# Patient Record
Sex: Male | Born: 1935 | Race: White | Hispanic: No | Marital: Single | Smoking: Former smoker
Health system: Southern US, Community
[De-identification: ages and names within clinical notes are randomized; demographics above are authoritative.]

## PROBLEM LIST (undated history)

## (undated) DIAGNOSIS — K069 Disorder of gingiva and edentulous alveolar ridge, unspecified: Secondary | ICD-10-CM

## (undated) DIAGNOSIS — C801 Malignant (primary) neoplasm, unspecified: Secondary | ICD-10-CM

## (undated) DIAGNOSIS — I10 Essential (primary) hypertension: Secondary | ICD-10-CM

## (undated) DIAGNOSIS — E785 Hyperlipidemia, unspecified: Secondary | ICD-10-CM

## (undated) DIAGNOSIS — K469 Unspecified abdominal hernia without obstruction or gangrene: Secondary | ICD-10-CM

## (undated) DIAGNOSIS — Z86718 Personal history of other venous thrombosis and embolism: Secondary | ICD-10-CM

## (undated) DIAGNOSIS — I2510 Atherosclerotic heart disease of native coronary artery without angina pectoris: Secondary | ICD-10-CM

## (undated) DIAGNOSIS — I480 Paroxysmal atrial fibrillation: Secondary | ICD-10-CM

## (undated) DIAGNOSIS — I495 Sick sinus syndrome: Secondary | ICD-10-CM

## (undated) DIAGNOSIS — K635 Polyp of colon: Secondary | ICD-10-CM

## (undated) DIAGNOSIS — I209 Angina pectoris, unspecified: Secondary | ICD-10-CM

## (undated) DIAGNOSIS — I499 Cardiac arrhythmia, unspecified: Secondary | ICD-10-CM

## (undated) DIAGNOSIS — M1990 Unspecified osteoarthritis, unspecified site: Secondary | ICD-10-CM

## (undated) HISTORY — DX: Sick sinus syndrome: I49.5

## (undated) HISTORY — PX: COLON SURGERY: SHX602

## (undated) HISTORY — DX: Essential (primary) hypertension: I10

## (undated) HISTORY — DX: Disorder of gingiva and edentulous alveolar ridge, unspecified: K06.9

## (undated) HISTORY — DX: Polyp of colon: K63.5

## (undated) HISTORY — PX: APPENDECTOMY: SHX54

## (undated) HISTORY — DX: Paroxysmal atrial fibrillation: I48.0

## (undated) HISTORY — DX: Unspecified abdominal hernia without obstruction or gangrene: K46.9

## (undated) HISTORY — DX: Atherosclerotic heart disease of native coronary artery without angina pectoris: I25.10

## (undated) HISTORY — DX: Hyperlipidemia, unspecified: E78.5

---

## 1999-11-01 ENCOUNTER — Ambulatory Visit (HOSPITAL_COMMUNITY): Admission: RE | Admit: 1999-11-01 | Discharge: 1999-11-01 | Payer: Self-pay | Admitting: *Deleted

## 1999-11-01 ENCOUNTER — Encounter (INDEPENDENT_AMBULATORY_CARE_PROVIDER_SITE_OTHER): Payer: Self-pay | Admitting: Specialist

## 1999-11-20 ENCOUNTER — Encounter (HOSPITAL_COMMUNITY): Admission: RE | Admit: 1999-11-20 | Discharge: 2000-02-18 | Payer: Self-pay | Admitting: *Deleted

## 1999-11-22 ENCOUNTER — Encounter: Admission: RE | Admit: 1999-11-22 | Discharge: 1999-11-22 | Payer: Self-pay

## 1999-11-30 ENCOUNTER — Encounter (INDEPENDENT_AMBULATORY_CARE_PROVIDER_SITE_OTHER): Payer: Self-pay | Admitting: Specialist

## 1999-11-30 ENCOUNTER — Inpatient Hospital Stay (HOSPITAL_COMMUNITY): Admission: RE | Admit: 1999-11-30 | Discharge: 1999-12-12 | Payer: Self-pay

## 1999-12-08 ENCOUNTER — Encounter: Payer: Self-pay | Admitting: General Surgery

## 1999-12-10 ENCOUNTER — Encounter: Payer: Self-pay | Admitting: General Surgery

## 2001-04-27 ENCOUNTER — Encounter: Admission: RE | Admit: 2001-04-27 | Discharge: 2001-04-27 | Payer: Self-pay | Admitting: *Deleted

## 2001-04-27 ENCOUNTER — Encounter: Payer: Self-pay | Admitting: *Deleted

## 2001-05-28 ENCOUNTER — Encounter (INDEPENDENT_AMBULATORY_CARE_PROVIDER_SITE_OTHER): Payer: Self-pay | Admitting: Specialist

## 2001-05-28 ENCOUNTER — Ambulatory Visit (HOSPITAL_COMMUNITY): Admission: RE | Admit: 2001-05-28 | Discharge: 2001-05-28 | Payer: Self-pay | Admitting: *Deleted

## 2002-07-08 ENCOUNTER — Ambulatory Visit (HOSPITAL_COMMUNITY): Admission: RE | Admit: 2002-07-08 | Discharge: 2002-07-08 | Payer: Self-pay | Admitting: Cardiology

## 2002-07-08 ENCOUNTER — Encounter: Payer: Self-pay | Admitting: Cardiology

## 2002-07-08 HISTORY — PX: CARDIAC CATHETERIZATION: SHX172

## 2002-07-12 ENCOUNTER — Encounter: Payer: Self-pay | Admitting: Thoracic Surgery (Cardiothoracic Vascular Surgery)

## 2002-07-12 ENCOUNTER — Inpatient Hospital Stay (HOSPITAL_COMMUNITY)
Admission: RE | Admit: 2002-07-12 | Discharge: 2002-07-17 | Payer: Self-pay | Admitting: Thoracic Surgery (Cardiothoracic Vascular Surgery)

## 2002-07-12 HISTORY — PX: OTHER SURGICAL HISTORY: SHX169

## 2002-07-13 ENCOUNTER — Encounter: Payer: Self-pay | Admitting: Thoracic Surgery (Cardiothoracic Vascular Surgery)

## 2002-07-14 ENCOUNTER — Encounter: Payer: Self-pay | Admitting: Thoracic Surgery (Cardiothoracic Vascular Surgery)

## 2002-08-11 ENCOUNTER — Encounter
Admission: RE | Admit: 2002-08-11 | Discharge: 2002-08-11 | Payer: Self-pay | Admitting: Thoracic Surgery (Cardiothoracic Vascular Surgery)

## 2002-08-11 ENCOUNTER — Encounter: Payer: Self-pay | Admitting: Thoracic Surgery (Cardiothoracic Vascular Surgery)

## 2003-07-20 ENCOUNTER — Encounter (INDEPENDENT_AMBULATORY_CARE_PROVIDER_SITE_OTHER): Payer: Self-pay | Admitting: *Deleted

## 2003-07-20 ENCOUNTER — Ambulatory Visit (HOSPITAL_COMMUNITY): Admission: RE | Admit: 2003-07-20 | Discharge: 2003-07-20 | Payer: Self-pay | Admitting: *Deleted

## 2005-07-09 ENCOUNTER — Encounter: Admission: RE | Admit: 2005-07-09 | Discharge: 2005-07-09 | Payer: Self-pay | Admitting: Internal Medicine

## 2005-07-18 ENCOUNTER — Encounter: Admission: RE | Admit: 2005-07-18 | Discharge: 2005-08-07 | Payer: Self-pay | Admitting: Internal Medicine

## 2005-09-18 ENCOUNTER — Ambulatory Visit (HOSPITAL_COMMUNITY): Admission: RE | Admit: 2005-09-18 | Discharge: 2005-09-18 | Payer: Self-pay | Admitting: *Deleted

## 2007-01-02 ENCOUNTER — Ambulatory Visit (HOSPITAL_COMMUNITY): Admission: RE | Admit: 2007-01-02 | Discharge: 2007-01-02 | Payer: Self-pay | Admitting: Ophthalmology

## 2007-11-04 ENCOUNTER — Ambulatory Visit (HOSPITAL_COMMUNITY): Admission: RE | Admit: 2007-11-04 | Discharge: 2007-11-04 | Payer: Self-pay | Admitting: *Deleted

## 2008-04-06 ENCOUNTER — Encounter: Admission: RE | Admit: 2008-04-06 | Discharge: 2008-04-06 | Payer: Self-pay | Admitting: Internal Medicine

## 2008-04-25 ENCOUNTER — Encounter: Admission: RE | Admit: 2008-04-25 | Discharge: 2008-04-25 | Payer: Self-pay | Admitting: Internal Medicine

## 2009-10-20 ENCOUNTER — Encounter: Admission: RE | Admit: 2009-10-20 | Discharge: 2009-10-20 | Payer: Self-pay | Admitting: Internal Medicine

## 2010-10-07 HISTORY — PX: VENTRAL HERNIA REPAIR: SHX424

## 2010-10-07 HISTORY — PX: HERNIA REPAIR: SHX51

## 2011-02-19 NOTE — Op Note (Signed)
NAME:  Darryl Hall, Darryl Hall NO.:  [id number]   MEDICAL RECORD NO.:  [id number]          PATIENT TYPE:  AMB   LOCATION:  ENDO                         FACILITY:  [facility]   PHYSICIAN:  Georgiana Spinner, M.D.    DATE OF BIRTH:  [birth year]   DATE OF PROCEDURE:  11/04/2007  DATE OF DISCHARGE:                               OPERATIVE REPORT   PROCEDURE:  Colonoscopy.   INDICATIONS:  Colon cancer.   ANESTHESIA:  Fentanyl 85 mcg, Versed 8.5 mg.   PROCEDURE:  With the patient mildly sedated in the left lateral  decubitus position, the Pentax videoscopic colonoscope was inserted in  the rectum after normal rectal exam and passed under direct vision to  the neo-cecum, was which was photographed.  From this point the  colonoscope was slowly withdrawn taking circumferential views of the  colonic mucosa stopping only in the rectum, which appeared normal on  direct and showed hemorrhoids on retroflexed view.  The endoscope was  straightened and withdrawn.   FINDINGS:  Internal hemorrhoids, otherwise negative examination.   PLAN:  Have the patient follow up with me in 5 years or as needed.  Resume all medications at this time.  In addition Romazicon and Narcan  were given.           ______________________________  Georgiana Spinner, M.D.     GMO/MEDQ  D:  11/04/2007  T:  11/04/2007  Job:  [id number]

## 2011-02-22 NOTE — Procedures (Signed)
[facility]  Patient:    Darryl Hall, Darryl Hall Visit Number: [id number] MRN: [id number]          Service Type: END Location: ENDO Attending Physician:  Sabino Gasser Proc. Date: 05/28/01 Adm. Date:  05/28/2001   CC:         Zigmund Daniel, M.D.   Procedure Report  PROCEDURE:  Colonoscopy with polypectomy and biopsy.  INDICATIONS:  Follow-up of colon cancer.  ANESTHESIA:  Demerol 50, Versed 6 mg.  DESCRIPTION OF PROCEDURE:  With the patient mildly sedated in the left lateral decubitus position, a rectal exam was performed which was unremarkable. Subsequently, the Olympus videoscopic colonoscope was inserted in the rectum and passed under direct vision to the small intestine anastomosis to the colon and the neocecum.  Photographs were taken.  From this point, the colonoscope was slowly withdrawn, taking circumferential view of the entire colonic mucosa until we stopped in the rectum, which appeared normal on direct view, but on retroflex view showed a polyp just proximal to the anal canal.  This was photographed and removed, first using hot biopsy forceps technique, subsequently using the snare cautery technique setting of 20-20 blended current for both.  Tissue was obtained for pathology.  The endoscope was straightened and withdrawn.  The patients vital signs and pulse oximeter remained stable.  The patient tolerated the procedure well without apparent complications.  FINDINGS:  Polyps of rectum.  PLAN:  Await biopsy report.  The patient will call me for results and follow up with me as an outpatient. Attending Physician:  Sabino Gasser DD:  05/28/01 TD:  05/29/01 Job: [id number]

## 2011-02-22 NOTE — Discharge Summary (Signed)
NAME:  Darryl Hall, Darryl Hall NO.:  [id number]   MEDICAL RECORD NO.:  [id number]                   PATIENT TYPE:  INP   LOCATION:  2004                                 FACILITY:  [facility]   PHYSICIAN:  Salvatore Decent. Dorris Fetch, M.D.         DATE OF BIRTH:  06-29-1926   DATE OF ADMISSION:  07/12/2002  DATE OF DISCHARGE:  07/17/2002                                 DISCHARGE SUMMARY   ADMISSION DIAGNOSIS:  Three vessel coronary artery disease with progressive  exertional dyspnea.   PAST MEDICAL HISTORY:  1. Colon cancer, status post colon surgery x2.  Both surgeries were     complicated by deep vein thrombosis and he has required an IVC filter.  2. Hyperlipidemia.  3. 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   PAST SURGICAL HISTORY:  1. Appendectomy.  2. Previous left cataract surgery.   ALLERGIES:  No known drug allergies.   DISCHARGE DIAGNOSIS:  Three vessel coronary artery disease with exertional  dyspnea, status post coronary artery bypass grafting.   HISTORY OF PRESENT ILLNESS:  The patient is a 77 year old man.  He has had  an approximately two year history of sharp right-sided chest pain which he  has previously attributed to previous colon surgeries.  He has no previous  cardiac history.  Over the past several months, shortness of breath with  exertion which is worsening recently.  He underwent an exercise challenge  test which was positive with ST changes.  He underwent cardiac  catheterization on 07/08/02, which revealed severe three vessel coronary  artery disease with a codominate system.  His lesions were not amenable to  PCI, and therefore a CVTS consult was requested.  The patient was evaluated  by Dr. Dorris Fetch later in the day on 07/08/02.  After examination, the  patient revealed all available records, including catheterization films.  Dr. Dorris Fetch recommended coronary artery bypass grafting as the preferred  treatment  choice for this gentleman.  Procedure, risks and benefits were  discussed with the patient, and he agreed to proceed.  Plan was for him to  stay off of his Coumadin until surgery, that will be one week total.  It was  felt that there was some risk of deep vein thrombosis formation in the  interim, he does have an IVC filter which will protect him from pulmonary  embolus.   HOSPITAL COURSE:  On 07/12/01, the patient was electively admitted to [facility] under the care of Dr. Charlett Lango.  He underwent the  following surgical procedure.  Coronary artery bypass grafting x6.  Grafts  placed at the time of procedure included a left internal mammary artery  grafted to the left anterior descending artery, saphenous vein was grafted  to the first diagonal artery, saphenous vein was grafted in a sequential  fashion to the first obtuse marginal and second obtuse marginal arteries,  saphenous vein was  grafted in a sequential fashion to the conus branch and  posterior descending artery.  Vein was harvested from the right thigh via  the endoscopic vein harvest technique, and in a conventional fashion from  the lower legs bilaterally.  He tolerated his procedure reasonably well, and  was transferred in stable condition to the SICU.   The patient has remained hemodynamically stable since surgery, and his  postoperative course has been essentially uneventful.  He was extubated in  the immediate postoperative period.  Awoke from anesthesia neurologically  intact.  His anticoagulation therapy was restarted on postoperative day #1,  with Lovenox and Coumadin.  The patient has had very good progress in  recovering from his surgery.  On the morning of 07/16/02, that is  postoperative day #5, the patient reported feeling very well, his vital  signs were stable, afebrile.  His heart was in normal sinus rhythm.  Lungs  were clear.  He is tolerating small amounts of food.  No nausea.  His bowel   and bladder functions were within normal limits for him.  His incisions were  all healing well.  He does have mild right lower extremity edema.  His pain  was well controlled.  He was ambulating independently.  The patient was  ready for discharge on the morning of 07/17/02.   LABORATORY DATA:  On the morning of 07/14/02, CBC showed a white blood cell  count of 8.9, hemoglobin 11.5, hematocrit 34.8, platelets 141.  The morning  of 07/15/02, PT 20.6, INR 2.0.  On 07/14/02, chemistries showed a sodium of  141, potassium 4.1, chloride 106, CO2 28, BUN 22, creatinine 1.1, calcium  8.3.   CONDITION ON DISCHARGE:  Improved.   DISCHARGE MEDICATIONS:  1. Ultram 50 mg one or two p.o. q.4-6h. p.r.n. pain.  2. Lopressor 25 mg p.o. b.i.d.  3. Coumadin 4 mg p.o. q.d.  4. Digoxin 0.125 mg p.o. q.d.   ACTIVITY:  He has been asked to refrain from any driving or heavy lifting,  pushing or pulling.  He has been asked to continue his breathing exercises  and daily walking.   DIET:  Low fat, low salt.   WOUND CARE:  He may shower with mild soap and water.  If his incisions are  red, hot, swollen draining, or if he has a fever greater then 101 degrees  Farenheit, he is to call Dr. Sunday Corn office.   FOLLOWUP:  1. He will have his blood drawn at [facility] on Monday,     07/19/02, for a PT and INR.  They will dose his Coumadin as appropriate.  2. Dr. Aleen Campi would like to see him in approximately two weeks.  He has     been asked to call to arrange that     appointment.  3. He has an appointment to see Dr. Dorris Fetch at the [facility] on Wednesday,     08/11/02 at 10:30 a.m.  He has been asked to get a chest x-ray at     [facility] approximately one hour before that     appointment.     Toribio Harbour, R.N.                  Salvatore Decent. Dorris Fetch, M.D.    CTK/MEDQ  D:  08/23/2002  T:  08/24/2002  Job:  [id number]  cc:   Aram Candela. Aleen Campi, M.D.  [address]  [place]  [place] [address]  Fax: [phone]  Christiana Fuchs, M.D.  [address]  [place], [place] [address]  Fax: [phone]

## 2011-02-22 NOTE — Op Note (Signed)
   NAME:  ESWIN, WORRELL NO.:  [id number]   MEDICAL RECORD NO.:  [id number]                   PATIENT TYPE:  AMB   LOCATION:  ENDO                                 FACILITY:  [facility]   PHYSICIAN:  Georgiana Spinner, M.D.                 DATE OF BIRTH:  [birth year]   DATE OF PROCEDURE:  DATE OF DISCHARGE:                                 OPERATIVE REPORT   PROCEDURE:  Colonoscopy with polypectomy.   INDICATIONS FOR PROCEDURE:  History of colon cancer, colon polyps.   ANESTHESIA:  Demerol 60, Versed 6 mg.   DESCRIPTION OF PROCEDURE:  With the patient mildly sedated in the left  lateral decubitus position, the Olympus videoscopic colonoscope was inserted  in the rectum and passed under direct vision to the neocecum. Once  identified, we were able to see the small bowel at its anastomosis. The  colonoscope was slowly withdrawn taking circumferential views of the entire  colonic mucosa stopping in the neocecum first where a polyp was seen,  photographed and removed using snare cautery technique setting of 20:20  blended current. The polyp was suctioned into the endoscope and retrieved  after removal. The endoscope was then withdrawn all the way to the rectum  which appeared normal on direct and showed hemorrhoids on retroflexed view.  The endoscope was straightened and withdrawn. The patient's vital signs and  pulse oximeter remained stable. The patient tolerated the procedure well  without apparent complications.   FINDINGS:  Internal hemorrhoids with polyp in the neocecum, otherwise, an  unremarkable colonoscopic examination.   PLAN:  Await biopsy report. The patient will call me for results and  followup with me as an outpatient.                                               Georgiana Spinner, M.D.    GMO/MEDQ  D:  07/20/2003  T:  07/20/2003  Job:  [id number]

## 2011-02-22 NOTE — Consult Note (Signed)
NAME:  Darryl Hall, Darryl Hall NO.:  [id number]   MEDICAL RECORD NO.:  [id number]                   PATIENT TYPE:  OIB   LOCATION:                                       FACILITY:  [facility]   PHYSICIAN:  Salvatore Decent. Dorris Fetch, M.D.         DATE OF BIRTH:  August 20, 1926   DATE OF CONSULTATION:  07/08/2002  DATE OF DISCHARGE:                                   CONSULTATION   REASON FOR CONSULTATION:  Three vessel coronary disease with exertional  dyspnea.   CHIEF COMPLAINT:  Shortness of breath with exertion.   HISTORY OF PRESENT ILLNESS:  The patient is a 77 year old gentleman who has  about a two-year history of a sharp right-sided chest pain which has been  attributed to previous colon surgeries.  He has no previous cardiac history.  Over the past several months, he has noted shortness of breath with exertion  and this has been worsening recently.  He does not have any chest pain or  tightness associated with this exertional dyspnea.  He does not have the  shortness of breath  at rest or at night.  It usually will resolve with  resting for a few minutes.  He does not have nausea or diaphoresis in  association with it.  He had an exercise tolerance test which was positive  with ST changes.  He did experience shortness of breath during the exercise  tolerance test, but not the chest pain which he had been having for the past  few years.  At cardiac catheterization  today he was found to have severe  three vessel coronary disease with a codominant system.  There is right  coronary with approximately  90% stenosis in the large internal branch and a  70% in the mid right.  In the circumflex, there is a large bifurcating OM-1  with a tight stenosis at the bifurcation approximately  95%.  There is also  disease in the circumflex applying a smaller posterior lateral branch.  He  has about a 70 to 75% mid LAD lesion.  There is also involvement of the  diagonal branch.  His  ejection fraction is 40% with inferior hypokinesis.  He also had mild anterior and moderate apical hypokinesis.   His past medical history is significant for colon cancer.  He has had  surgery x 2.  Both times complicated by deep venous thrombosis and has  required IVC filter.  He has had hyperlipidemia and blindness in his left  eye secondary to detached retina.  He has had an appendectomy and previous  left cataract surgery.   MEDICATIONS:  1. Toprol 25 mg p.o. q.d.  2. Coumadin 4 mg p.o. q.d.  On hold since Monday.   ALLERGIES:  He has no known drug allergies.   FAMILY HISTORY:  His father died at age 52 of  myocardial infarction .  Mother was unknown causes.   SOCIAL  HISTORY:  He is retired.  He stays very active working at  [employer].  He is married.  He has a past history of smoking, but quit many  years ago.  He does not use ethanol.   REVIEW OF SYSTEMS:  He wears glasses.  He is completely blind in his left  eye.  He is hard of hearing.  He has dentures both upper and lower.  He  occasionally has a cough with sputum production.  He does have nocturnal leg  cramps.  He has pain in his right shoulder and knee chronically.  He had a  skin cancer removed from his lip one week ago, likely a basal cell.  He does  bleed and bruise easily since he has been on Coumadin.   PHYSICAL EXAMINATION:  GENERAL:  The patient is a well-appearing 77 year old  white male in no acute distress.  In general,  he is well developed and well  nourished in no acute distress.  He is hard of hearing.  VITALS:  His blood pressure  is 144/84, heart rate is 70 and regular,  respirations are 12 and nonlabored.  NEUROLOGIC:  He is alert and oriented x 3.  His left pupil is nonreactive.  He has no vision in his left eye. Motor function is grossly intact.  HEENT:  He has dentures and glasses.  He has a scar formation from removal  of the skin cancer just above his upper lip. It does not appear  infected.  NECK:  Supple without thyromegaly, adenopathy or bruits.  CARDIAC:  Regular rate and rhythm.  Normal S1, S2.  No murmurs, rubs or  gallops.  LUNGS:  Clear to auscultation  and percussion bilaterally.  EXTREMITIES:  Without clubbing, cyanosis or edema.  He does have 1+ dorsalis  pedis and posterior tibial pulses bilaterally.  There are no obvious  varicosities.  SKIN:  Warm, pink and dry.   IMPRESSION:  The patient is a 77 year old white male who presents with  progressive exertional dyspnea.  He has severe two vessel disease by  catheterization with impaired left ventricular function.  Coronary artery  bypass grafting is indicated for survival benefit and relief of symptoms.  He also has a chest pain syndrome which does not appear to be related to his  coronary disease as it is nonexertional and has existed prior to his onset  of shortness of breath.  I have discussed in detail with the patient  and  his wife the indications, risks, benefits and alternatives for treatment.  They understand the incision to be used with bypass surgery and the need for  multiple grafts.  We will use thigh vein because of his history of deep  venous thrombosis and may need to use vein from both legs.  They understand  the risks of  surgery include, but are not limited to stroke, death, MI,  DVT, PE, which is at increased risk for bleeding, possible need for  transfusion, infection as well as other organ system dysfunction including  respiratory, renal, hepatic or GI complications.  He understands and accepts  these risks and agrees to proceed.  He will be scheduled for surgery as an  outpatient with plan to come in  Monday morning on 10/06.  He has been off  his Coumadin since this past Monday,  that will be one week.  Although there  is some risk of  deep venous thrombosis formation in the interim,  he does have an  IVC filter which will protect him from pulmonary embolus.  The risk  of deep  venous thrombosis formation in the short time period he will be off  Coumadin is inconsequential and he will need to be off the Coumadin for a  week prior to his surgery in any event.  The patient is in agreement with  the plan as outlined.  He will be admitted on 10/06.                                               Salvatore Decent Dorris Fetch, M.D.    SCH/MEDQ  D:  07/08/2002  T:  07/09/2002  Job:  [id number]   cc:   Christiana Fuchs, M.D.  [address]  [place], [place] [address]  Fax: [phone]

## 2011-02-22 NOTE — Cardiovascular Report (Signed)
NAME:  Darryl Hall, Darryl Hall NO.:  [id number]   MEDICAL RECORD NO.:  [id number]                   PATIENT TYPE:  OIB   LOCATION:  2857                                 FACILITY:  [facility]   PHYSICIAN:  John R. Tysinger, M.D.              DATE OF BIRTH:  [birth year]   DATE OF PROCEDURE:  07/08/2002  DATE OF DISCHARGE:                              CARDIAC CATHETERIZATION   CARDIOLOGIST:  Aram Candela. Aleen Campi, M.D.   PROCEDURES:  1. Left heart catheterization.  2. Coronary cineangiography.  3. Left internal mammary artery cineangiography.  4. Left ventricular cineangiography.  5. Abdominal aortogram.  6. Perclose of the right femoral artery.   INDICATIONS FOR PROCEDURE:  This 77 year old male has a history of chest  pain and high-risk profile with hypertension and recurrent deep vein  thrombosis requiring Greenfield filter placement in his inferior vena cava.  He has been on chronic Coumadin.  He also has a history of smoking and  hyperlipidemia.  His family history includes father of a heart attack at age  54.  He underwent a treadmill exercise tolerance test on 06/30/2002 finding a  positive test with 2 mm ST depression in early stages with a treadmill score  of -4 consistent with a moderate to severe risk of ischemia.   DESCRIPTION OF PROCEDURE:  After signing an informed consent, the patient  was medication with 50 mg of Benadryl intravenously and brought to the  cardiac catheterization lab.  His right groin was prepped and draped in a  sterile fashion and anesthetized locally with 1% lidocaine. The 6-French  introducer sheath was inserted percutaneously into the right femoral artery.  The 6-French #4 Judkins coronary catheters were used to make injections into  the native coronary arteries.  The right coronary catheter was used to make  midstream injection into the left subclavian artery visualizing the left  internal mammary artery.  A 6-French pigtail  catheter was used to measure  pressures in the left ventricle and aorta and to make midstream injection  into the left ventricle and abdominal aorta. The patient tolerated the  procedure well,and no complications were noted.  At the end of the  procedure, the catheter and sheaths were removed from the right femoral  artery, and hemostasis was easily obtained with a Perclose closure systems.   MEDICATIONS GIVEN:  None.   HEMODYNAMIC DATA:  Left ventricular pressure 180/10 to 26.  Aortic pressure  178/94 with a mean of 126.  Left ventricular ejection fraction was measured  by computer at 36%, visually it appears to be approximately 35 to 40%.   CINE FINDINGS:  CORONARY CINEANGIOGRAPHY:  1. Left coronary artery:  The ostium and left main appear normal.  2. Left anterior descending:  The LAD has a focal eccentric 70 to 80%     stenotic lesion in its proximal to middle segment.  There is a second  minor plaque in the middle segment causing a 20% stenosis.  The distal     LAD is a large vessel which wraps around the apex and supplies     approximately 50% of  the posterior descending circulation.  3. Circumflex coronary artery:  The circumflex has a mild plaque in its     proximal segment causing a 10 to 20% stenosis.  The continuation in the A-     V groove is a large vessel which supplies the posterolateral circulation     to the left ventricle and appears normal.  There is a large first obtuse     marginal branch which has a bifurcation its middle segment.  This     bifurcation has a 95% stenosis which is just prior to the bifurcation,     and the plaque extends into the distal leg of the obtuse marginal branch.     There was antegrade flow.  The distal circumflex in the A-V groove and     including the posterolateral branches appear normal.  4. Right coronary artery:  The right coronary artery is a small vessel which     is mildly codominant and terminates with a small posterior  descending     branch which supplies approximately 50% of the posterior descending     distribution.  There is a large bifurcation in the proximal segment of     this small right coronary artery with a large right ventricular branch.     This right ventricular branch has a focal eccentric 70% stenosis. The     right coronary artery in the A-V groove has a focal eccentric lesion at     the acute angle causing a 70% stenosis.   LEFT INTERNAL MAMMARY ARTERY APPEARS NORMAL.  The midstream injection into the left subclavian artery shows a normal  takeoff with an irregular plaque in the proximal segment.  The subclavian  then has a marked kink or area of marked tortuosity in its proximal segment  just prior to the takeoff of the left internal mammary artery.  The left  internal mammary artery appears to have normal flow and is without disease.   LEFT VENTRICULAR CINEANGIOGRAM:  The left ventricular chamber size is mildly enlarged.  The overall left  ventricular contractility is mild to moderately decreased.  There is mild to  moderate anteroapical hypokinesia, and there is moderate apical hypokinesia.  The inferior segment has normal contractility, and the anterobasal segment  has normal contractility.  Ejection fraction was estimated at approximately  35 to 40%.  The calculated ejection fraction was 29%.   ABDOMINAL AORTOGRAM:  A midstream injection into the abdominal aorta shows mild calcified plaque  in the distal abdominal aorta.  The suprarenal abdominal aorta appears  normal.  The renal arteries also appear normal with normal flow.   FINAL DIAGNOSES:  1. Three-vessel coronary artery disease with 80% mid left anterior     descending artery lesion, 95% large obtuse marginal lesion at the     bifurcation, and 70% mid right coronary artery lesion.  2. Small right coronary artery with mild codominance supplying 50% of the     posterior descending. 3. Mild to moderate left ventricular  dysfunction with ejection fraction     approximately 35 to 40% with mild to moderate anterior and moderate     apical hypokinesia.  4. Normal left internal mammary artery.  5. Moderate tortuosity and kinking of the proximal left subclavian artery.  6. Normal abdominal aorta and renal arteries.  7. Successful Perclose of the right femoral artery.    DISPOSITION:  After conferring with Dr. Nanetta Batty and Dr. Eden Emms, we  elected to recommend coronary artery bypass graft surgery for this patient.  We will return him to the short-stay unit for monitoring prior to discharge  today.  We have asked [facility] to see him either today or arrange for a followup  visit in their office for evaluation for coronary artery bypass graft  surgery.                                                John R. Tysinger, M.D.    JRT/MEDQ  D:  07/08/2002  T:  07/12/2002  Job:  [id number]   cc:   Christiana Fuchs, M.D.  [address]  [place], [place] [address]  Fax: [phone]   [facility]

## 2011-02-22 NOTE — Discharge Summary (Signed)
NAME:  Darryl Hall, Darryl Hall NO.:  [id number]   MEDICAL RECORD NO.:  [id number]                   PATIENT TYPE:   LOCATION:                                       FACILITY:   PHYSICIAN:  Salvatore Decent. Dorris Fetch, M.D.         DATE OF BIRTH:  02/02/1926   DATE OF ADMISSION:  07/08/2002  DATE OF DISCHARGE:  07/17/2002                                 DISCHARGE SUMMARY   ADMITTING DIAGNOSIS:  Coronary artery disease.   PAST MEDICAL HISTORY:  1. Colon cancer status post surgery x2, complicated by DVT, requiring an IVC     filter, he is on chronic Coumadin therapy.  2. Hyperlipidemia.  3. 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  4. Surgical history includes appendectomy and left cataract surgery.   ALLERGIES:  He has no known drug allergies.   DISCHARGE DIAGNOSES:  Coronary artery disease, status post coronary artery  bypass graft.   BRIEF HISTORY:  The patient is a 77 year old man with a 3-year history of  right-sided chest pain that has been attributed to his previous colon  surgery.  For the past few months, he has experienced progressive shortness  of breath with exertion.  An exercise tolerance test was positive for ST  changes.  Dr. Aleen Campi recommended cardiac catheterization.  Coumadin was  stopped on July 05, 2002.   HOSPITAL COURSE:  On October 2, Dr. Aleen Campi performed a cardiac  catheterization, which revealed 3-vessel coronary artery disease not  amenable to PCA intervention.  CVTS consult was requested, and the patient  was evaluated by Dr. Charlett Lango.  After examination of patient and  review of all records, Dr. Dorris Fetch recommended coronary artery bypass  grafting ______________.  The procedure was discussed, and he agreed to  proceed.   On October 6, the patient underwent an uncomplicated coronary artery bypass  grafting x6 with Dr. Dorris Fetch.  Grafts placed at the time of procedure:  Left internal artery up  to left anterior descending artery, saphenous vein  extraction in sequential fashion to the marginal and PDA arteries, saphenous  venous grafting to the diagonal artery, saphenous vein was grafted in  sequential fashion to the first obtuse marginal and second obtuse marginal  arteries.  Vein was harvested from the right thigh for the bypass grafts  using Endovein technique, as well as through the conventional fashion from  bilateral lower legs.  He tolerated this procedure well, and was transferred  in stable condition to the SICU.  He has remained hemodynamically stable  since surgery, and his postoperative course has been uneventful   Anticoagulation therapy was initiated on post-op day #1 with Lovenox and  Coumadin.   Postoperative issues include on postoperative day #2 a short run of  nonsustained V-tach that was asymptomatic.  He also had an episode of rapid  atrial fibrillation on postoperative day 4.  Digoxin protocol was initiated  at this  time.  He did return to normal sinus rhythm and has maintained that  rhythm since.  On the morning of October 11, postoperative day #5, the  patient reports doing very well, his vital signs are stable, and his heart  has normal sinus rhythm.  His lungs are clear to auscultation bilaterally.  He is tolerating his diet.  His bowel and bladder functions are within  normal limits for him.  He is ambulating the hallway.  His pain is well-  controlled.  His Coumadin is therapeutic today with an INR of 2.0.  He is  ready for discharge home.   RECENT LABORATORY STUDIES:  October 8:  White blood cells 8.9, hemoglobin  11.5, hematocrit 34.8, platelets 141.  Sodium 141, potassium 4.1, chloride  106, CO2 28, BUN 22, creatinine 1.1, glucose 108.   CONDITION ON DISCHARGE:  Improved.   MEDICATIONS:  1. Ultram 50 mg 1 to 2 p.o. q. 4-6 hours p.r.n. for pain.  2. Lopressor 50 mg one-half tablet p.o. b.i.d. in the a.m. and p.m.  3. Coumadin 4 mg p.o. daily  until he has his PT and INR check.  4. Digoxin 0.125 mg p.o. q.d.   ACTIVITIES:  He has been asked to refrain from any driving or any heavy  lifting, pushing, or pulling.  He has been asked to continue his breathing  exercises and daily walking.   DIET:  Diet should be low-fat, low-salt diet.   WOUND CARE:  He may shower with mild soap and water.  If his incisions  become red, hot, swollen, draining, or if he has a fever greater than 101  degrees Fahrenheit, he has been asked to call Dr. Sunday Corn office.   FOLLOW UP:  1. He will have his blood drawn at [facility] on Monday,     October 13, for a PT and INR and will contact him with appropriate     Coumadin dose.  2. Dr. Aleen Campi would like to see him in the office in approximately 2     weeks, and he has been asked to call to arrange an appointment.  3. He has an appointment to see Dr. Dorris Fetch at CVTS office on Wednesday,     November 5, at 10:30, and he has been asked to get a chest x-ray at [facility]     at 9:30 that morning and bring it with him to his appointment.     Toribio Harbour, R.N.                  Salvatore Decent. Dorris Fetch, M.D.    CTK/MEDQ  D:  07/19/2002  T:  07/20/2002  Job:  [id number]   cc:   Salvatore Decent. Dorris Fetch, M.D.  [address]  [place]  [place] [address]  Fax: [phone]   Aram Candela. Aleen Campi, M.D.  [address]  [place]  [place] [address]  Fax: [phone]

## 2011-02-22 NOTE — Op Note (Signed)
NAME:  [name], Darryl Hall NO.:  [id number]   MEDICAL RECORD NO.:  [id number]          PATIENT TYPE:  AMB   LOCATION:  ENDO                         FACILITY:  [facility]   PHYSICIAN:  Georgiana Spinner, M.D.    DATE OF BIRTH:  03-11-1926   DATE OF PROCEDURE:  09/18/2005  DATE OF DISCHARGE:                                 OPERATIVE REPORT   PROCEDURE:  Colonoscopy.   INDICATIONS:  Colon cancer.   ANESTHESIA:  Demerol 50, Versed 5 mg.   DESCRIPTION OF PROCEDURE:  With the patient mildly sedated in the left  lateral decubitus position, a rectal exam was performed which was  unremarkable. Subsequently the Olympus videoscopic colonoscope was inserted  into the rectum and passed under direct vision to the cecum identified by  the findings  of the neocecum which were photographed. From this point, the  colonoscope slowly withdrawn taking circumferential views of the colonic  mucosa suctioning as we went until we reached the rectum which appeared  normal on direct and showed hemorrhoids on retroflexed view. The endoscope  was straightened and withdrawn. The patient's vital signs and pulse oximeter  remained stable. The patient tolerated the procedure well without apparent  complications.   FINDINGS:  Internal hemorrhoids otherwise an unremarkable colonoscopic  examination to the neocecum.   PLAN:  Consider repeat examination in 2-5 years.           ______________________________  Georgiana Spinner, M.D.     GMO/MEDQ  D:  09/18/2005  T:  09/18/2005  Job:  [id number]

## 2011-02-22 NOTE — Op Note (Signed)
NAME:  Darryl Hall, Darryl Hall NO.:  [id number]   MEDICAL RECORD NO.:  [id number]                   PATIENT TYPE:  INP   LOCATION:  2306                                 FACILITY:  [facility]   PHYSICIAN:  Salvatore Decent. Dorris Fetch, M.D.         DATE OF BIRTH:  1926/06/01   DATE OF PROCEDURE:  07/12/2002  DATE OF DISCHARGE:                                 OPERATIVE REPORT   PREOPERATIVE DIAGNOSIS:  Progressive exertional dyspnea and three-vessel  coronary disease.   POSTOPERATIVE DIAGNOSIS:  Progressive exertional dyspnea and three-vessel  coronary disease.   PROCEDURES:  1. Median sternotomy.  2. Extracorporeal circulation.  3. Coronary artery bypass grafting x six (left internal mammary artery to     left anterior descending, saphenous vein graft to first diagonal,     sequential saphenous vein graft to obtuse marginal one and two,     sequential saphenous vein graft to conus branch and posterior     descending).  4. Endoscopic vein harvest from right thigh and conventional vein harvest     from lower legs bilaterally.   SURGEON:  Salvatore Decent. Dorris Fetch, M.D.   ASSISTANT:  Lissa Merlin, P.A.   ANESTHESIA:  General.   FINDINGS:  Vein from right thigh, only approximately one-half the length was  usable.  Additional vein harvest required from right lower leg and left  lower leg to have adequate conduit.  Mammary and vein that was used was of  good quality and good targets.   CLINICAL NOTE:  The patient is a 77 year old gentleman with multiple cardiac  risk factors.  He presents with progressive exertional dyspnea.  At cardiac  catheterization, he had severe three-vessel coronary disease and was  referred for coronary artery bypass graft.  The indications, risks, benefits  and alternative treatments were discussed in detail with the patient.  He  understood and accepted the risks and agreed to proceed.   OPERATIVE NOTE:  The patient was brought to the  preoperative holding area on  July 12, 2002.  Lines were placed to monitor arterial, central venous and  pulmonary arterial pressure.  Electrocardiogram leads were placed for  continuous telemetry.  The patient was taken to the operating room,  anesthetized and intubated.  A Foley catheter was placed.  Intravenous  antibiotics were administered.  The chest, abdomen and legs were prepped and  draped in the usual fashion.   A median sternotomy was performed and the left anterior descending was  harvested using a standard technique.  Simultaneously an incision was made  in the medial aspect of the right leg at the knee.  The greater saphenous  vein was identified and harvested from the thigh endoscopically.  Because of  the length of vein needed, additional vein was harvested from the right  lower leg using conventional technique.  The patient was fully heparinized  prior to dividing the distal end of the left mammary artery.  There  was  excellent flow through the cutting of the vessel.  The mammary was placed in  a papaverine-soaked sponge.   The pericardium was opened.  The ascending aorta was inspected and palpated.  There was palpable atherosclerotic disease posteriorly at the site of  cannulation.  This did not interfere with clamp placement.  There was no  palpable disease anteriorly.  The aorta was cannulated via concentric 2-0  Ethibond pledgetted pursestring sutures.  A dual-stage venous cannula was  placed via pursestring suture in the right atrial appendage.  Cardiopulmonary bypass was instituted and the patient was cooled to 32-  degrees Celsius.  The coronary arteries were inspected and anastomotic sites  were chosen.  The conduits were inspected and cut to length.  Of note the  third obtuse marginal and the second diagonal were both small vessels and  not suitable for grafting.  The remainder of the vessels that were selected  for grafting were of good quality vessels.   Additional vein harvest from the  left lower leg was required in order to have adequate conduit.  A foam pad  was placed in the pericardium to protect the left phrenic nerve.  A  temperature probe was placed in the myocardial septum and a cardioplegic  cannula was placed in the ascending aorta.   The aorta was crossclamped. The left ventricle was emptied via the aortic  root vein.  Cardiac arrest then was achieved with a combination of cold  antegrade blood cardioplegia and topical iced saline.  One liter of  cardioplegia was administered.  The myocardial septal temperature was 12-  degrees Celsius.  The following distal anastomoses were performed:   First a reversed saphenous vein graft was placed sequentially to the conus  branch at the acute margin of the heart and the posterior descending branch  of the right coronary.  The conus branch was a 1.5-mm good quality target.  A side-to-side anastomosis was performed off the side branch of the vein  graft at this vessel.  An end-to-side anastomosis then was performed to the  posterior descending which also was a 1.5-mm good quality vessel.  Both were  performed with a running 7-0 Prolene suture.  There was excellent flow  through the graft.  Cardioplegia was administered and there was good  hemostasis at both anastomoses.   Next a reversed saphenous vein graft was placed end-to-side to the first  diagonal branch of the LAD.  This was a 1.5-mm good quality target.  The  vein graft was of smaller caliber but still of good quality.  Again the  anastomosis was performed with a running 7-0 Prolene suture.  There was good  flow through this graft.  Cardioplegia was administered and there was good  hemostasis.   Next a reversed saphenous vein graft was placed sequentially to obtuse  marginals one and two.  These arose as a common trunk from the circumflex which then bifurcated.  There was a tight stenosis at the bifurcation.  Both  branches  following the bifurcation were 1.5-mm good quality targets.  A side-  to-side anastomosis was performed to OM1 and an end-to-side to OM2.  Both  were performed with running 7-0 Prolene sutures.  At the completion of the  anastomoses, cardioplegia was administered.  There was good flow through the  graft and there was good hemostasis.   Next the left internal mammary artery was brought through a window in the  pericardium.  It was a 2.5-mm good quality  vessel with excellent flow.  The  distal end was spatulated and was anastomosed end-to-side to the distal LAD  which was a 2-mm good quality target.  The anastomosis was performed with a  running 8-0 Prolene suture.  At completion of the mammary to LAD  anastomosis, the bulldog clamp was removed from the mammary artery to  inspect for hemostasis.  Immediate and rapid septal rewarming was noted.  The bulldog clamp was placed.  Additional cardioplegia was administered as  the vein grafts were cut to length.   The cardioplegic cannula was removed from the ascending aorta.  The proximal  vein graft anastomoses were performed to 4.0-mm punch aortotomies with  running 6-0 Prolene sutures.  At the completion of the final proximal  anastomosis, the bulldog clamp was once again removed from the left internal  mammary artery.  The aortic root was deaired and the aortic crossclamp was  removed.  The total crossclamp time was 87 minutes.  The vein grafts were  deaired prior to removing the bulldogs.  All proximal and distal anastomoses  were inspected for hemostasis.  Epicardial pacing wires were placed on the  right ventricle and right atrium.  The patient was started on a Dopamine  drip.  He was in sinus rhythm and was atrially paced for rate.  When then  core temperature reached 37-degrees Celsius, the patient was weaned from  cardiopulmonary bypass without difficulty.  The total bypass time was 176  minutes.  The initial cardiac index was greater  then two liters per minute  per metered squared and the patient remained hemodynamically stable  throughout the post bypass period.   A test dose of Protamine was administered and was well-tolerated.  The  atrial and aortic cannulae were removed.  There was good hemostasis at both  cannulation sites.  The remainder of the Protamine was administered without  incident.  The chest was irrigated with one liter of warm normal saline  containing 1 g of vancomycin.  Hemostasis was achieved.  The pericardium was  reapproximated with interrupted 3-0 silk sutures that came together easily  without tension.  The sternum was closed with heavy guage stainless steel  wires.  The pectoralis fascia was closed with a running #1 Vicryl suture.  The subcutaneous tissue was closed with a running 2-0 Vicryl suture.  The  subcutaneous tissue and skin were closed in a standard fashion with subcuticular skin closures.  All sponge, needle and instrument counts were  correct at the end of the procedure.  There were no intraoperative  complications and the patient was taken from the operating room to the  surgical intensive care unit, intubated, and in stable condition.                                               Salvatore Decent Dorris Fetch, M.D.    SCH/MEDQ  D:  07/12/2002  T:  07/13/2002  Job:  [id number]   cc:   Aram Candela. Aleen Campi, M.D.  [address]  [place]  [place] [address]  Fax: [phone]   Christiana Fuchs, M.D.  [address]  [place], [place] [address]  Fax: [phone]

## 2011-03-22 ENCOUNTER — Encounter (HOSPITAL_COMMUNITY): Payer: Self-pay | Admitting: Radiology

## 2011-03-22 ENCOUNTER — Emergency Department (HOSPITAL_COMMUNITY): Payer: Medicare Other

## 2011-03-22 ENCOUNTER — Emergency Department (HOSPITAL_COMMUNITY)
Admission: EM | Admit: 2011-03-22 | Discharge: 2011-03-22 | Disposition: A | Discharge status: Home / Self Care | Payer: Medicare Other | Attending: Emergency Medicine | Admitting: Emergency Medicine

## 2011-03-22 DIAGNOSIS — K439 Ventral hernia without obstruction or gangrene: Secondary | ICD-10-CM | POA: Insufficient documentation

## 2011-03-22 DIAGNOSIS — Z85038 Personal history of other malignant neoplasm of large intestine: Secondary | ICD-10-CM | POA: Insufficient documentation

## 2011-03-22 DIAGNOSIS — Z951 Presence of aortocoronary bypass graft: Secondary | ICD-10-CM | POA: Insufficient documentation

## 2011-03-22 DIAGNOSIS — I2510 Atherosclerotic heart disease of native coronary artery without angina pectoris: Secondary | ICD-10-CM | POA: Insufficient documentation

## 2011-03-22 DIAGNOSIS — Z86718 Personal history of other venous thrombosis and embolism: Secondary | ICD-10-CM | POA: Insufficient documentation

## 2011-03-22 DIAGNOSIS — I10 Essential (primary) hypertension: Secondary | ICD-10-CM | POA: Insufficient documentation

## 2011-03-22 DIAGNOSIS — R1033 Periumbilical pain: Secondary | ICD-10-CM | POA: Insufficient documentation

## 2011-03-22 HISTORY — DX: Malignant (primary) neoplasm, unspecified: C80.1

## 2011-03-22 LAB — POCT I-STAT, CHEM 8
BUN: 25 mg/dL — ABNORMAL HIGH (ref 6–23)
Calcium, Ion: 1.12 mmol/L (ref 1.12–1.32)
Chloride: 104 mEq/L (ref 96–112)
Creatinine, Ser: 1.2 mg/dL (ref 0.50–1.35)
Glucose, Bld: 87 mg/dL (ref 70–99)
HCT: 42 % (ref 39.0–52.0)
Hemoglobin: 14.3 g/dL (ref 13.0–17.0)
Potassium: 3.9 mEq/L (ref 3.5–5.1)
Sodium: 139 mEq/L (ref 135–145)
TCO2: 25 mmol/L (ref 0–100)

## 2011-03-22 LAB — DIFFERENTIAL
Basophils Absolute: 0 10*3/uL (ref 0.0–0.1)
Basophils Relative: 0 % (ref 0–1)
Eosinophils Absolute: 0.1 10*3/uL (ref 0.0–0.7)
Eosinophils Relative: 2 % (ref 0–5)
Lymphocytes Relative: 13 % (ref 12–46)
Lymphs Abs: 1 10*3/uL (ref 0.7–4.0)
Monocytes Absolute: 0.8 10*3/uL (ref 0.1–1.0)
Monocytes Relative: 10 % (ref 3–12)
Neutro Abs: 6 10*3/uL (ref 1.7–7.7)
Neutrophils Relative %: 76 % (ref 43–77)

## 2011-03-22 LAB — CBC
HCT: 38.1 % — ABNORMAL LOW (ref 39.0–52.0)
Hemoglobin: 12.2 g/dL — ABNORMAL LOW (ref 13.0–17.0)
MCH: 28.9 pg (ref 26.0–34.0)
MCHC: 32 g/dL (ref 30.0–36.0)
MCV: 90.3 fL (ref 78.0–100.0)
Platelets: 174 10*3/uL (ref 150–400)
RBC: 4.22 MIL/uL (ref 4.22–5.81)
RDW: 13.4 % (ref 11.5–15.5)
WBC: 7.9 10*3/uL (ref 4.0–10.5)

## 2011-03-22 LAB — PROTIME-INR
INR: 1.94 — ABNORMAL HIGH (ref 0.00–1.49)
Prothrombin Time: 22.5 seconds — ABNORMAL HIGH (ref 11.6–15.2)

## 2011-03-22 LAB — APTT: aPTT: 40 seconds — ABNORMAL HIGH (ref 24–37)

## 2011-03-22 MED ORDER — IOHEXOL 300 MG/ML  SOLN
100.0000 mL | Freq: Once | INTRAMUSCULAR | Status: AC | PRN
  Administered 2011-03-22: 100 mL via INTRAVENOUS

## 2011-03-28 ENCOUNTER — Encounter (INDEPENDENT_AMBULATORY_CARE_PROVIDER_SITE_OTHER): Payer: Self-pay | Admitting: Surgery

## 2011-04-19 ENCOUNTER — Other Ambulatory Visit (INDEPENDENT_AMBULATORY_CARE_PROVIDER_SITE_OTHER): Payer: Self-pay | Admitting: Surgery

## 2011-04-19 ENCOUNTER — Ambulatory Visit (HOSPITAL_COMMUNITY)
Admission: RE | Admit: 2011-04-19 | Discharge: 2011-04-19 | Disposition: A | Discharge status: Home / Self Care | Payer: Medicare Other | Source: Ambulatory Visit | Attending: Surgery | Admitting: Surgery

## 2011-04-19 ENCOUNTER — Encounter (HOSPITAL_COMMUNITY): Payer: Medicare Other

## 2011-04-19 DIAGNOSIS — Z01811 Encounter for preprocedural respiratory examination: Secondary | ICD-10-CM

## 2011-04-19 DIAGNOSIS — Z01812 Encounter for preprocedural laboratory examination: Secondary | ICD-10-CM | POA: Insufficient documentation

## 2011-04-19 DIAGNOSIS — K432 Incisional hernia without obstruction or gangrene: Secondary | ICD-10-CM | POA: Insufficient documentation

## 2011-04-19 LAB — CBC
HCT: 44.2 % (ref 39.0–52.0)
Hemoglobin: 14.3 g/dL (ref 13.0–17.0)
MCH: 29.7 pg (ref 26.0–34.0)
MCHC: 32.4 g/dL (ref 30.0–36.0)
MCV: 91.9 fL (ref 78.0–100.0)
Platelets: 187 10*3/uL (ref 150–400)
RBC: 4.81 MIL/uL (ref 4.22–5.81)
RDW: 13.8 % (ref 11.5–15.5)
WBC: 8.5 10*3/uL (ref 4.0–10.5)

## 2011-04-19 LAB — BASIC METABOLIC PANEL
BUN: 29 mg/dL — ABNORMAL HIGH (ref 6–23)
CO2: 30 mEq/L (ref 19–32)
Calcium: 9.4 mg/dL (ref 8.4–10.5)
Chloride: 104 mEq/L (ref 96–112)
Creatinine, Ser: 0.99 mg/dL (ref 0.50–1.35)
GFR calc Af Amer: >60 mL/min (ref >60)
GFR calc non Af Amer: >60 mL/min (ref >60)
Glucose, Bld: 77 mg/dL (ref 70–99)
Potassium: 4.3 mEq/L (ref 3.5–5.1)
Sodium: 139 mEq/L (ref 135–145)

## 2011-04-19 LAB — SURGICAL PCR SCREEN
MRSA, PCR: NEGATIVE
Staphylococcus aureus: NEGATIVE

## 2011-04-19 LAB — PROTIME-INR
INR: 1.52 — ABNORMAL HIGH (ref 0.00–1.49)
Prothrombin Time: 18.6 seconds — ABNORMAL HIGH (ref 11.6–15.2)

## 2011-04-19 LAB — APTT: aPTT: 34 seconds (ref 24–37)

## 2011-04-26 ENCOUNTER — Inpatient Hospital Stay (HOSPITAL_COMMUNITY)
Admission: RE | Admit: 2011-04-26 | Discharge: 2011-05-02 | DRG: 330 | Disposition: A | Discharge status: Home / Self Care | Payer: Medicare Other | Source: Ambulatory Visit | Attending: Surgery | Admitting: Surgery

## 2011-04-26 DIAGNOSIS — Z86718 Personal history of other venous thrombosis and embolism: Secondary | ICD-10-CM

## 2011-04-26 DIAGNOSIS — I4891 Unspecified atrial fibrillation: Secondary | ICD-10-CM | POA: Diagnosis present

## 2011-04-26 DIAGNOSIS — K560 Paralytic ileus: Secondary | ICD-10-CM | POA: Diagnosis not present

## 2011-04-26 DIAGNOSIS — Z9049 Acquired absence of other specified parts of digestive tract: Secondary | ICD-10-CM

## 2011-04-26 DIAGNOSIS — S36509A Unspecified injury of unspecified part of colon, initial encounter: Secondary | ICD-10-CM

## 2011-04-26 DIAGNOSIS — Z951 Presence of aortocoronary bypass graft: Secondary | ICD-10-CM

## 2011-04-26 DIAGNOSIS — Z5331 Laparoscopic surgical procedure converted to open procedure: Secondary | ICD-10-CM

## 2011-04-26 DIAGNOSIS — I2510 Atherosclerotic heart disease of native coronary artery without angina pectoris: Secondary | ICD-10-CM | POA: Diagnosis present

## 2011-04-26 DIAGNOSIS — IMO0002 Reserved for concepts with insufficient information to code with codable children: Secondary | ICD-10-CM | POA: Diagnosis not present

## 2011-04-26 DIAGNOSIS — K432 Incisional hernia without obstruction or gangrene: Secondary | ICD-10-CM

## 2011-04-26 DIAGNOSIS — K430 Incisional hernia with obstruction, without gangrene: Principal | ICD-10-CM | POA: Diagnosis present

## 2011-04-26 DIAGNOSIS — I10 Essential (primary) hypertension: Secondary | ICD-10-CM | POA: Diagnosis present

## 2011-04-26 DIAGNOSIS — Z85038 Personal history of other malignant neoplasm of large intestine: Secondary | ICD-10-CM

## 2011-04-26 DIAGNOSIS — Z7901 Long term (current) use of anticoagulants: Secondary | ICD-10-CM

## 2011-04-26 LAB — PROTIME-INR
INR: 1.15 (ref 0.00–1.49)
Prothrombin Time: 14.9 seconds (ref 11.6–15.2)

## 2011-04-27 LAB — CBC
HCT: 42.1 % (ref 39.0–52.0)
Hemoglobin: 13.9 g/dL (ref 13.0–17.0)
MCH: 30 pg (ref 26.0–34.0)
MCHC: 33 g/dL (ref 30.0–36.0)
MCV: 90.7 fL (ref 78.0–100.0)
Platelets: 175 10*3/uL (ref 150–400)
RBC: 4.64 MIL/uL (ref 4.22–5.81)
RDW: 13.7 % (ref 11.5–15.5)
WBC: 17.8 10*3/uL — ABNORMAL HIGH (ref 4.0–10.5)

## 2011-04-27 LAB — BASIC METABOLIC PANEL
BUN: 33 mg/dL — ABNORMAL HIGH (ref 6–23)
BUN: 33 mg/dL — ABNORMAL HIGH (ref 6–23)
CO2: 27 mEq/L (ref 19–32)
CO2: 25 mEq/L (ref 19–32)
Calcium: 8.3 mg/dL — ABNORMAL LOW (ref 8.4–10.5)
Calcium: 8.3 mg/dL — ABNORMAL LOW (ref 8.4–10.5)
Chloride: 102 mEq/L (ref 96–112)
Chloride: 100 mEq/L (ref 96–112)
Creatinine, Ser: 1.47 mg/dL — ABNORMAL HIGH (ref 0.50–1.35)
Creatinine, Ser: 1.11 mg/dL (ref 0.50–1.35)
GFR calc Af Amer: 55 mL/min — ABNORMAL LOW (ref >60)
GFR calc Af Amer: >60 mL/min (ref >60)
GFR calc non Af Amer: 46 mL/min — ABNORMAL LOW (ref >60)
GFR calc non Af Amer: >60 mL/min (ref >60)
Glucose, Bld: 115 mg/dL — ABNORMAL HIGH (ref 70–99)
Glucose, Bld: 117 mg/dL — ABNORMAL HIGH (ref 70–99)
Potassium: 6.6 mEq/L (ref 3.5–5.1)
Potassium: 4.9 mEq/L (ref 3.5–5.1)
Sodium: 135 mEq/L (ref 135–145)
Sodium: 131 mEq/L — ABNORMAL LOW (ref 135–145)

## 2011-04-27 LAB — POTASSIUM: Potassium: 6 mEq/L — ABNORMAL HIGH (ref 3.5–5.1)

## 2011-04-27 NOTE — Op Note (Signed)
  NAME:  Darryl Hall, Darryl Hall NO.:  [id number]  MEDICAL RECORD NO.:  [id number]  LOCATION:  DAYL                         FACILITY:  [facility]  PHYSICIAN:  Valetta Fuller, MD    DATE OF BIRTH:  05/23/1926  DATE OF PROCEDURE: DATE OF DISCHARGE:                              OPERATIVE REPORT   PREOPERATIVE DIAGNOSIS:  Inability to place Foley catheter preoperatively and blood per urethra status post catheterization attempts.  POSTOPERATIVE DIAGNOSIS:  Inability to place Foley catheter preoperatively and blood per urethra status post catheterization attempts.  PROCEDURE PERFORMED:  Flexible cystoscopy and placement of 18-French Coude Foley catheter.  INDICATIONS:  [name] is 77 years of age.  He has a history of colon cancer and is undergoing additional general surgical procedure today. The patient is asleep but does not appear to have any known preoperative urologic history or problems.  No family members are available to provide any additional history.  Catheter attempts preoperatively were unsuccessful and blood was noted per urethra.  TECHNIQUE AND FINDINGS:  The patient underwent flexible cystoscopy. There was some oozing from his prostatic fossa consistent with some Foley trauma.  Moderate trilobar hyperplasia with the median bar.  The bladder itself showed no obvious pathology, although visualization somewhat obscured by hematuria.  There was no evidence of stricturing, obvious stone, bladder neck contracture, etc.  No obvious evidence of prior prostate surgery.  An 18-French Coude catheter was placed. Initially obtained bloody urine with some small clots but the urine turned to light pink color.  We recommended indwelling Foley catheter x48 hours and removal when appropriate.  The patient may benefit from some Flomax postoperatively if he does have any preoperative voiding symptoms or has difficulty voiding postoperatively.     Valetta Fuller,  MD     DSG/MEDQ  D:  04/26/2011  T:  04/26/2011  Job:  [id number]  Electronically Signed by Barron Alvine M.D. on 04/27/2011 04:33:22 PM

## 2011-04-28 LAB — BASIC METABOLIC PANEL
BUN: 41 mg/dL — ABNORMAL HIGH (ref 6–23)
CO2: 25 mEq/L (ref 19–32)
Calcium: 8.7 mg/dL (ref 8.4–10.5)
Chloride: 99 mEq/L (ref 96–112)
Creatinine, Ser: 1.16 mg/dL (ref 0.50–1.35)
GFR calc Af Amer: >60 mL/min (ref >60)
GFR calc non Af Amer: >60 mL/min (ref >60)
Glucose, Bld: 155 mg/dL — ABNORMAL HIGH (ref 70–99)
Potassium: 4.6 mEq/L (ref 3.5–5.1)
Sodium: 132 mEq/L — ABNORMAL LOW (ref 135–145)

## 2011-04-28 LAB — CBC
HCT: 42 % (ref 39.0–52.0)
Hemoglobin: 13.7 g/dL (ref 13.0–17.0)
MCH: 29.8 pg (ref 26.0–34.0)
MCHC: 32.6 g/dL (ref 30.0–36.0)
MCV: 91.5 fL (ref 78.0–100.0)
Platelets: 190 10*3/uL (ref 150–400)
RBC: 4.59 MIL/uL (ref 4.22–5.81)
RDW: 13.7 % (ref 11.5–15.5)
WBC: 20.3 10*3/uL — ABNORMAL HIGH (ref 4.0–10.5)

## 2011-04-28 LAB — DIFFERENTIAL
Band Neutrophils: 0 % (ref 0–10)
Basophils Absolute: 0 10*3/uL (ref 0.0–0.1)
Basophils Relative: 0 % (ref 0–1)
Blasts: 0 %
Eosinophils Absolute: 0 10*3/uL (ref 0.0–0.7)
Eosinophils Relative: 0 % (ref 0–5)
Lymphocytes Relative: 2 % — ABNORMAL LOW (ref 12–46)
Lymphs Abs: 0.4 10*3/uL — ABNORMAL LOW (ref 0.7–4.0)
Metamyelocytes Relative: 0 %
Monocytes Absolute: 0.6 10*3/uL (ref 0.1–1.0)
Monocytes Relative: 3 % (ref 3–12)
Myelocytes: 0 %
Neutro Abs: 19.3 10*3/uL — ABNORMAL HIGH (ref 1.7–7.7)
Neutrophils Relative %: 95 % — ABNORMAL HIGH (ref 43–77)
Promyelocytes Absolute: 0 %
nRBC: 0 /100 WBC

## 2011-04-29 NOTE — Op Note (Signed)
NAME:  Darryl Hall, Darryl Hall NO.:  [id number]  MEDICAL RECORD NO.:  [id number]  LOCATION:  DAYL                         FACILITY:  [facility]  PHYSICIAN:  Abigail Miyamoto, M.D. DATE OF BIRTH:  1925-12-29  DATE OF PROCEDURE:  04/26/2011 DATE OF DISCHARGE:                              OPERATIVE REPORT   PREOPERATIVE DIAGNOSIS:  Ventral incisional hernia.  POSTOPERATIVE DIAGNOSIS:  Ventral incisional hernia.  PROCEDURES: 1. Laparoscopic converted open incisional hernia repair. 2. Repair of colotomy.  SURGEON:  Abigail Miyamoto, MD  ANESTHESIA:  General.  ESTIMATED BLOOD LOSS:  Minimal.  INDICATIONS:  Darryl Hall is an 77 year old gentleman who has had multiple abdominal procedures.  He now presents with a chronically incarcerated ventral incisional hernia, causing to have discomfort, and appears on CAT scan to only contain omentum.  FINDINGS:  The patient was found to have a large amount of adhesions. Upon  entering the abdomen with an OptiVu scope, the 5-mm trocar went right into colon that was transfixed to the abdominal wall.  Therefore, decision was made to convert to an open procedure, repair the hernia primarily, and fix the colotomy.  PROCEDURE IN DETAIL:  The patient brought to the operating room, identified as Darryl Hall.  He is placed on the operating table and general anesthesia was induced.  His abdomen was then prepped and draped in the usual sterile fashion.  Using a #15 blade, a small incision was made in the patient's right upper quadrant.  The OptiVu 5-mm port and camera were then used to attempt entrance into the abdominal cavity. The port appeared to go easily.  Upon entering the peritoneum, it became apparent that the 5-mm trocar was inside colon.  Given this finding, decision was made to medially convert to an open procedure.  I thus opened the patient's midline with #15 blade through the previous scar. This was carried down through  the fascia, and the hernia with electrocautery.  The hernia was found exsanguinated omentum which easily reduced.  The patient had a large amount of omentum to the abdominal wall which I was able to take down circumferentially.  I then identified where the 5-mm port had come through and opened up a hole in the hepatic flexure of the colon.  There was no gross intra-abdominal contamination. At this point,  I circumferentially identified the colon, found only anterior entrance wound, and no evidence of posterior injury.  I closed this open area in 2 layers with an interrupted 2-0 silk suture and then interrupted 3-0 silk suture.  I then was able to easily milk air back to the area and saw no evidence of leak.  I then thoroughly irrigated the abdomen with multiple liters of normal saline.  Again, I evaluated the colotomy site, it appeared to be intact, and again, there was no other evidence of holes in the intestines.  At this point, I then closed the patient's midline fascia including the small ventral hernia defect with a running #1 looped PDS suture as well as interrupted #1 Novafil internal retention sutures.  The skin was then irrigated and closed with skin staples.  The wound protector was used during the procedure as well.  The patient  tolerated the procedure well.  All counts were correct at the end of procedure.  Prior to extubating, a Foley catheter was inserted.  All sponge, needle and instrument counts were correct at the end of procedure.  The patient was then extubated in operating and taken in a stable condition to the recovery room.     Abigail Miyamoto, M.D.     DB/MEDQ  D:  04/26/2011  T:  04/26/2011  Job:  [id number]  Electronically Signed by Abigail Miyamoto M.D. on 04/29/2011 08:23:33 AM

## 2011-04-30 LAB — CBC
HCT: 34 % — ABNORMAL LOW (ref 39.0–52.0)
Hemoglobin: 11.2 g/dL — ABNORMAL LOW (ref 13.0–17.0)
MCH: 29.9 pg (ref 26.0–34.0)
MCHC: 32.9 g/dL (ref 30.0–36.0)
MCV: 90.9 fL (ref 78.0–100.0)
Platelets: 164 10*3/uL (ref 150–400)
RBC: 3.74 MIL/uL — ABNORMAL LOW (ref 4.22–5.81)
RDW: 13.3 % (ref 11.5–15.5)
WBC: 9.5 10*3/uL (ref 4.0–10.5)

## 2011-04-30 LAB — BASIC METABOLIC PANEL
BUN: 31 mg/dL — ABNORMAL HIGH (ref 6–23)
CO2: 28 mEq/L (ref 19–32)
Calcium: 8.4 mg/dL (ref 8.4–10.5)
Chloride: 101 mEq/L (ref 96–112)
Creatinine, Ser: 0.93 mg/dL (ref 0.50–1.35)
GFR calc Af Amer: >60 mL/min (ref >60)
GFR calc non Af Amer: >60 mL/min (ref >60)
Glucose, Bld: 104 mg/dL — ABNORMAL HIGH (ref 70–99)
Potassium: 3.5 mEq/L (ref 3.5–5.1)
Sodium: 135 mEq/L (ref 135–145)

## 2011-04-30 LAB — PROTIME-INR
INR: 1.11 (ref 0.00–1.49)
Prothrombin Time: 14.5 seconds (ref 11.6–15.2)

## 2011-05-01 ENCOUNTER — Inpatient Hospital Stay (HOSPITAL_COMMUNITY): Payer: Medicare Other

## 2011-05-01 LAB — PROTIME-INR
INR: 1.17 (ref 0.00–1.49)
Prothrombin Time: 15.1 seconds (ref 11.6–15.2)

## 2011-05-02 LAB — PROTIME-INR
INR: 1.18 (ref 0.00–1.49)
Prothrombin Time: 15.3 seconds — ABNORMAL HIGH (ref 11.6–15.2)

## 2011-05-13 ENCOUNTER — Ambulatory Visit (INDEPENDENT_AMBULATORY_CARE_PROVIDER_SITE_OTHER): Payer: Medicare Other | Admitting: Surgery

## 2011-05-13 ENCOUNTER — Encounter (INDEPENDENT_AMBULATORY_CARE_PROVIDER_SITE_OTHER): Payer: Self-pay | Admitting: Surgery

## 2011-05-13 DIAGNOSIS — Z09 Encounter for follow-up examination after completed treatment for conditions other than malignant neoplasm: Secondary | ICD-10-CM | POA: Insufficient documentation

## 2011-05-13 NOTE — Progress Notes (Signed)
Subjective:     Patient ID: Darryl Hall, male   DOB: 1926-06-22, 77 y.o.   MRN: [id number]  HPI He is here for his first postoperative visit status post ventral hernia repair. At the time of surgery a colotomy was created with the Optiview scope. The procedure was converted to an open procedure. The colotomy with repair along with a hernia. He is doing well except for a poor appetite. He reports he is working on this. He has no discomfort. He is eating well and moving his bowels well.  Review of Systems     Objective:   Physical Exam On exam, he is incisions are healing well. We removed the staples and place Steri-Strips. There is no evidence of wound infection. He is afebrile and his vital signs are stable.    Assessment:     Patient status post ventral hernia repair    Plan:     He will refrain from heavy lifting for 6 more weeks. I will see him back in one month. He will come back sooner should he develop any abdominal complaints.

## 2011-05-22 NOTE — Discharge Summary (Signed)
  NAME:  Darryl Hall, Darryl Hall NO.:  [id number]  MEDICAL RECORD NO.:  [id number]  LOCATION:  1402                         FACILITY:  [facility]  PHYSICIAN:  Abigail Miyamoto, M.D. DATE OF BIRTH:  Apr 10, 1926  DATE OF ADMISSION:  04/26/2011 DATE OF DISCHARGE:  05/02/2011                              DISCHARGE SUMMARY   DISCHARGE DIAGNOSIS:  Ventral incisional hernia status post laparoscopic converted to open hernia repair with mesh and repair of a colotomy.  SUMMARY OF HISTORY:  This is an 77 year old gentleman who presents with asymptomatic ventral incisional hernia.  Because of the discomfort, decision was made to proceed with repair.  HOSPITAL COURSE:  Patient was admitted and taken to the operating room where he underwent a laparoscopic converted to open incisional repair with mesh.  He had repair of colotomy at that time.  He also did have a Foley placed by Urology as it was difficult to pass in the operating room.  He tolerated this well, was taken to the regular surgical floor on postop day #1.  He was stable, his creatinine was 1.47.  Potassium was initially elevated,  but came down quickly and with IV hydration, his potassium and creatinine returned to normal.  By postop day #3, he continued to improve and his Foley catheter was removed and he began voiding well and was started on clear liquid diet.  By postop day #4, his laboratory data again was normal,  his PCA was stopped, and his diet was further advanced.  By May 02, 2011, he was having bowel movements, he was tolerating a regular diet.  His abdomen was soft.  His incision was healing well.  Chest x-ray did show bilateral effusions, atelectasis, but he improved with ambulation and at this point his oxygen was stopped and he was discharged home.  DISCHARGE DIET:  Regular.  DISCHARGE ACTIVITY:  He will do no heavy lifting greater than 20 pounds for 6 weeks.  He may shower.  DISCHARGE FOLLOWUP:  He will  follow up with [facility] in 1 week post-discharge.     Abigail Miyamoto, M.D.     DB/MEDQ  D:  05/15/2011  T:  05/16/2011  Job:  [id number]  Electronically Signed by Abigail Miyamoto M.D. on 05/22/2011 01:39:51 PM

## 2011-06-13 ENCOUNTER — Ambulatory Visit (INDEPENDENT_AMBULATORY_CARE_PROVIDER_SITE_OTHER): Payer: Medicare Other | Admitting: Surgery

## 2011-06-13 ENCOUNTER — Encounter (INDEPENDENT_AMBULATORY_CARE_PROVIDER_SITE_OTHER): Payer: Self-pay | Admitting: Surgery

## 2011-06-13 VITALS — BP 110/62 | HR 72

## 2011-06-13 DIAGNOSIS — Z09 Encounter for follow-up examination after completed treatment for conditions other than malignant neoplasm: Secondary | ICD-10-CM

## 2011-06-13 NOTE — Progress Notes (Signed)
Subjective:     Patient ID: Darryl Hall, male   DOB: 03-Mar-1926, 77 y.o.   MRN: [id number]  HPI He is here for a one-month postoperative followup. He is having no abdominal discomfort. He still has some difficulty swallowing but he reports this has improved. He is moving his bowels well and eating well.  Review of Systems     Objective:   Physical Exam On exam, his voice is normal. His swallowing appears normal. His abdomen is soft. His incision is well-healed. There is no evidence of recurrent hernia    Assessment:       Patient status post ventral hernia repair Plan:     He may not return to normal activity including heavy lifting. His swallowing difficulty appears to be resolving. If this continues I encouraged him to see his primary care physician. He may need referral to an ENT physician for evaluation. He does feel like it is almost back to normal. I will see him as needed.

## 2011-07-23 ENCOUNTER — Encounter (INDEPENDENT_AMBULATORY_CARE_PROVIDER_SITE_OTHER): Payer: Self-pay | Admitting: General Surgery

## 2011-07-25 ENCOUNTER — Encounter (INDEPENDENT_AMBULATORY_CARE_PROVIDER_SITE_OTHER): Payer: Self-pay | Admitting: Surgery

## 2011-07-29 ENCOUNTER — Ambulatory Visit (INDEPENDENT_AMBULATORY_CARE_PROVIDER_SITE_OTHER): Payer: Medicare Other | Admitting: Surgery

## 2011-07-29 ENCOUNTER — Encounter (INDEPENDENT_AMBULATORY_CARE_PROVIDER_SITE_OTHER): Payer: Self-pay | Admitting: Surgery

## 2011-07-29 VITALS — BP 124/70 | HR 88 | Temp 97.6°F | Resp 16 | Ht 68.0 in | Wt 167.2 lb

## 2011-07-29 DIAGNOSIS — T8189XA Other complications of procedures, not elsewhere classified, initial encounter: Secondary | ICD-10-CM

## 2011-07-29 DIAGNOSIS — IMO0002 Reserved for concepts with insufficient information to code with codable children: Secondary | ICD-10-CM

## 2011-07-29 NOTE — Patient Instructions (Signed)
Do not take your Coumadin Friday through Monday this week

## 2011-07-29 NOTE — Progress Notes (Signed)
Subjective:     Patient ID: Darryl Hall, male   DOB: 16-Nov-1925, 77 y.o.   MRN: [id number]  HPI He is here for a followup visit. He is having pain from a suture at the upper portion of his incision. Otherwise he is doing well  Review of Systems     Objective:   Physical Exam On exam, he indeed has a suture just underneath the skin and which is tender. There is no evidence of infection    Assessment:     Patient with a small stitch which is a Prolene causing pain at his midline incision    Plan:      As he is having significant discomfort, and I plan to remove the suture in the office. I will have him stop his Coumadin several days prior to this. I will see him back next week

## 2011-08-05 ENCOUNTER — Encounter (INDEPENDENT_AMBULATORY_CARE_PROVIDER_SITE_OTHER): Payer: Medicare Other | Admitting: Surgery

## 2011-08-06 ENCOUNTER — Encounter (INDEPENDENT_AMBULATORY_CARE_PROVIDER_SITE_OTHER): Payer: Self-pay | Admitting: Surgery

## 2011-08-12 ENCOUNTER — Ambulatory Visit (INDEPENDENT_AMBULATORY_CARE_PROVIDER_SITE_OTHER): Payer: Medicare Other | Admitting: Surgery

## 2011-08-12 ENCOUNTER — Encounter (INDEPENDENT_AMBULATORY_CARE_PROVIDER_SITE_OTHER): Payer: Self-pay | Admitting: Surgery

## 2011-08-12 VITALS — BP 122/72 | HR 60 | Temp 96.7°F | Resp 16 | Ht 68.0 in | Wt 168.4 lb

## 2011-08-12 DIAGNOSIS — T8189XA Other complications of procedures, not elsewhere classified, initial encounter: Secondary | ICD-10-CM

## 2011-08-12 DIAGNOSIS — IMO0002 Reserved for concepts with insufficient information to code with codable children: Secondary | ICD-10-CM

## 2011-08-12 NOTE — Progress Notes (Signed)
Subjective:     Patient ID: Darryl Hall, male   DOB: 1926-07-04, 76 y.o.   MRN: [id number]  HPI He reports that the stitches still palpable. He has been off his Coumadin for 3 days  Review of Systems     Objective:   Physical Exam I anesthetized the skin with lidocaine after prepping with Betadine. I made a small incision over the palpable stitch and then was able to remove the stitch with a scalpel. I then closed the wound with a Monocryl suture and Steri-Strips.    Assessment:     Patient status post stitch abscess    Plan:     Wound care instructions were given. He will resume his Coumadin tonight. I will see him back as needed

## 2011-11-19 ENCOUNTER — Other Ambulatory Visit (HOSPITAL_COMMUNITY): Payer: Self-pay | Admitting: Internal Medicine

## 2011-11-19 DIAGNOSIS — R112 Nausea with vomiting, unspecified: Secondary | ICD-10-CM

## 2011-11-20 ENCOUNTER — Ambulatory Visit (HOSPITAL_COMMUNITY)
Admission: RE | Admit: 2011-11-20 | Discharge: 2011-11-20 | Disposition: A | Discharge status: Home / Self Care | Payer: Medicare Other | Source: Ambulatory Visit | Attending: Internal Medicine | Admitting: Internal Medicine

## 2011-11-20 DIAGNOSIS — R112 Nausea with vomiting, unspecified: Secondary | ICD-10-CM | POA: Insufficient documentation

## 2011-11-20 DIAGNOSIS — K224 Dyskinesia of esophagus: Secondary | ICD-10-CM | POA: Insufficient documentation

## 2011-11-20 DIAGNOSIS — K449 Diaphragmatic hernia without obstruction or gangrene: Secondary | ICD-10-CM | POA: Insufficient documentation

## 2011-11-20 DIAGNOSIS — K219 Gastro-esophageal reflux disease without esophagitis: Secondary | ICD-10-CM | POA: Insufficient documentation

## 2013-04-26 ENCOUNTER — Other Ambulatory Visit: Payer: Self-pay | Admitting: *Deleted

## 2013-04-26 MED ORDER — LISINOPRIL 20 MG PO TABS: 20.0000 mg | ORAL_TABLET | Freq: Every day | ORAL | Status: DC

## 2013-10-14 ENCOUNTER — Ambulatory Visit (INDEPENDENT_AMBULATORY_CARE_PROVIDER_SITE_OTHER): Payer: Medicare Other | Admitting: Cardiovascular Disease

## 2013-10-14 ENCOUNTER — Encounter: Payer: Self-pay | Admitting: Cardiovascular Disease

## 2013-10-14 VITALS — BP 112/60 | HR 54 | Ht 67.0 in | Wt 183.0 lb

## 2013-10-14 DIAGNOSIS — H5440 Blindness, one eye, unspecified eye: Secondary | ICD-10-CM

## 2013-10-14 DIAGNOSIS — I82409 Acute embolism and thrombosis of unspecified deep veins of unspecified lower extremity: Secondary | ICD-10-CM

## 2013-10-14 DIAGNOSIS — I10 Essential (primary) hypertension: Secondary | ICD-10-CM

## 2013-10-14 DIAGNOSIS — I2510 Atherosclerotic heart disease of native coronary artery without angina pectoris: Secondary | ICD-10-CM

## 2013-10-14 DIAGNOSIS — I480 Paroxysmal atrial fibrillation: Secondary | ICD-10-CM

## 2013-10-14 DIAGNOSIS — E785 Hyperlipidemia, unspecified: Secondary | ICD-10-CM

## 2013-10-14 DIAGNOSIS — I495 Sick sinus syndrome: Secondary | ICD-10-CM

## 2013-10-14 DIAGNOSIS — I82401 Acute embolism and thrombosis of unspecified deep veins of right lower extremity: Secondary | ICD-10-CM

## 2013-10-14 DIAGNOSIS — I4891 Unspecified atrial fibrillation: Secondary | ICD-10-CM

## 2013-10-14 NOTE — Patient Instructions (Signed)
Dr. Croitoru recommends that you schedule a follow-up appointment in: ONE YEAR   

## 2013-10-15 ENCOUNTER — Encounter: Payer: Self-pay | Admitting: Cardiovascular Disease

## 2013-10-15 DIAGNOSIS — I495 Sick sinus syndrome: Secondary | ICD-10-CM | POA: Insufficient documentation

## 2013-10-15 DIAGNOSIS — I2510 Atherosclerotic heart disease of native coronary artery without angina pectoris: Secondary | ICD-10-CM | POA: Insufficient documentation

## 2013-10-15 DIAGNOSIS — E785 Hyperlipidemia, unspecified: Secondary | ICD-10-CM | POA: Insufficient documentation

## 2013-10-15 DIAGNOSIS — I82409 Acute embolism and thrombosis of unspecified deep veins of unspecified lower extremity: Secondary | ICD-10-CM | POA: Insufficient documentation

## 2013-10-15 DIAGNOSIS — H5440 Blindness, one eye, unspecified eye: Secondary | ICD-10-CM | POA: Insufficient documentation

## 2013-10-15 DIAGNOSIS — I480 Paroxysmal atrial fibrillation: Secondary | ICD-10-CM | POA: Insufficient documentation

## 2013-10-15 DIAGNOSIS — I10 Essential (primary) hypertension: Secondary | ICD-10-CM | POA: Insufficient documentation

## 2013-10-15 NOTE — Progress Notes (Signed)
Patient ID: Darryl Hall, male   DOB: 1926/06/11, 80 y.o.   MRN: [id number]     Reason for office visit Darryl Hall is a delightful 80 year old man who is now almost 12 years status post coronary bypass surgery, but has not had recurrent coronary events or other cardiac problems during the interim. He also has a history of paroxysmal atrial fibrillation but is not receiving rate control antiarrhythmic therapy since he has a tendency towards marked sinus bradycardia. Atrial fibrillation has not been a problem recently. He is on chronic warfarin anti-coagulation, which is prescribed also for history of recurrent lower extremity deep venous thrombosis. He has had 2 surgeries for colon cancer on each occasion developed right lower extremity DVT. He has an inferior vena cava filter that has been left in place. The INR is monitored by Darryl Hall.  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 On the other hand he has stopped participating in the [employer] classes he use to enjoy. He couldn't keep up anymore, although he denies problems with chest pain or shortness of breath.   No Known Allergies  Current Outpatient Prescriptions  Medication Sig Dispense Refill  . gabapentin (NEURONTIN) 100 MG capsule Take 200 mg by mouth 2 (two) times daily.      Marland Kitchen lisinopril (PRINIVIL,ZESTRIL) 20 MG tablet Take 1 tablet (20 mg total) by mouth daily.  90 tablet  1  . lovastatin (MEVACOR) 40 MG tablet Take 40 mg by mouth at bedtime.      . Multiple Vitamins-Minerals (PRESERVISION AREDS 2 PO) Take 2 tablets by mouth daily.      . Omega-3 Fatty Acids (FISH OIL) 500 MG CAPS Take 1 capsule by mouth daily.      . traMADol (ULTRAM) 50 MG tablet Take 50 mg by mouth 2 (two) times daily.      . Warfarin Sodium (COUMADIN PO) Take by mouth 1 day or 1 dose. 3mg  S/M/W/F; 4 1/2mg  T/TH/Sat       No current facility-administered medications for this visit.    Past  Medical History  Diagnosis Date  . Hernia   . colon ca dx'd 1998 & 2002    colon resection x 2   . Gum disease     or dentures  . Colon polyp   . CAD (coronary artery disease)   . PAF (paroxysmal atrial fibrillation)     H/O  . Systemic hypertension   . Dyslipidemia   . Sinus node dysfunction     Past Surgical History  Procedure Laterality Date  . Colon surgery      cancer  . Cardiac bypass  07/12/2002    LIMA to LAD,SVG to first diagonal,SVG to obtuse marginal one and two, SVG to conus branch & posterior descending.  . Ventral hernia repair  2012  . Hernia repair  2012    Family History  Problem Relation Age of Onset  . Heart disease Father   . Cancer Brother     pt unaware of what kind    History   Social History  . Marital Status: Married    Spouse Name: N/A    Number of Children: N/A  . Years of Education: N/A   Occupational History  . Not on file.   Social History Main Topics  . Smoking status: Former Research scientist (life sciences)  . Smokeless tobacco: Never Used  . Alcohol Use: No  . Drug Use: No  . Sexual Activity:  Other Topics Concern  . Not on file   Social History Narrative  . No narrative on file    Review of systems: The patient specifically denies any chest pain at rest or with exertion, dyspnea at rest or with exertion, orthopnea, paroxysmal nocturnal dyspnea, syncope, palpitations, focal neurological deficits, intermittent claudication, lower extremity edema, unexplained weight gain, cough, hemoptysis or wheezing. She denies fatigue, although as mentioned he no longer goes to exercise at the gym.  The patient also denies abdominal pain, nausea, vomiting, dysphagia, diarrhea, constipation, polyuria, polydipsia, dysuria, hematuria, frequency, urgency, abnormal bleeding or bruising, fever, chills, unexpected weight changes, mood swings, change in skin or hair texture, change in voice quality, auditory or visual problems, allergic reactions or rashes, new  musculoskeletal complaints other than usual "aches and pains".   PHYSICAL EXAM BP 112/60  Pulse 54  Ht 5\' 7"  (1.702 m)  Wt 183 lb (83.008 kg)  BMI 28.66 kg/m2  General: Alert, oriented x3, no distress Head: no evidence of trauma, the left pupil is irregular and nonreactive status post surgery, EOMI, no exophtalmos or lid lag, no myxedema, no xanthelasma; normal ears, nose and oropharynx Neck: normal jugular venous pulsations and no hepatojugular reflux; brisk carotid pulses without delay and no carotid bruits Chest: clear to auscultation, no signs of consolidation by percussion or palpation, normal fremitus, symmetrical and full respiratory excursions. Sternotomy scar is present Cardiovascular: normal position and quality of the apical impulse, regular rhythm, normal first and second heart sounds, no murmurs, rubs or gallops Abdomen: no tenderness or distention, no masses by palpation, no abnormal pulsatility or arterial bruits, normal bowel sounds, no hepatosplenomegaly Extremities: no clubbing, cyanosis or edema; 2+ radial, ulnar and brachial pulses bilaterally; 2+ right femoral, posterior tibial and dorsalis pedis pulses; 2+ left femoral, posterior tibial and dorsalis pedis pulses; no subclavian or femoral bruits Neurological: grossly nonfocal   EKG: Sinus bradycardia with PACs, otherwise normal  BMET    Component Value Date/Time   NA 135 04/30/2011 0525   K 3.5 04/30/2011 0525   CL 101 04/30/2011 0525   CO2 28 04/30/2011 0525   GLUCOSE 104* 04/30/2011 0525   BUN 31* 04/30/2011 0525   CREATININE 0.93 04/30/2011 0525   CALCIUM 8.4 04/30/2011 0525   GFRNONAA >60 04/30/2011 0525   GFRAA >60 04/30/2011 0525     ASSESSMENT AND PLAN History of recurrent DVT of lower extremity (deep venous thrombosis) He should remain on lifelong warfarin anticoagulation for his history of DVT and also for atrial fibrillation. No signs of acute venous thrombosis at this time.  Essential  hypertension Excellent control. Avoid beta blockers and centrally acting calcium channel blockers secondary to bradycardia appear  Hyperlipidemia The last lipid profile that I have on file is from 2013 but was excellent. He had more recent lipids performed in his primary care physician's office and will get those results  Paroxysmal atrial fibrillation Occurred in the remote past and no episodes have occurred recently, at least none that were clinically apparent. He should not be giving negative chronotropic agents or antiarrhythmics to 2 sinus bradycardia. If atrial fibrillation with rapid ventricular response becomes a problem he may need a pacemaker to allow adequate therapy.  Sinus node dysfunction Suspect that he has chronotropic incompetence and suggested that he may benefit from a permanent pacemaker. He insists that his fatigue is not a problem and does not want invasive/implantation type procedures at this time.   Orders Placed This Encounter  Procedures  . EKG 12-Lead  Meds ordered this encounter  Medications  . lovastatin (MEVACOR) 40 MG tablet    Sig: Take 40 mg by mouth at bedtime.  . traMADol (ULTRAM) 50 MG tablet    Sig: Take 50 mg by mouth 2 (two) times daily.  . Omega-3 Fatty Acids (FISH OIL) 500 MG CAPS    Sig: Take 1 capsule by mouth daily.  . Multiple Vitamins-Minerals (PRESERVISION AREDS 2 PO)    Sig: Take 2 tablets by mouth daily.  Marland Kitchen gabapentin (NEURONTIN) 100 MG capsule    Sig: Take 200 mg by mouth 2 (two) times daily.    Holli Humbles, MD, [place] [phone] office [phone] pager

## 2013-10-15 NOTE — Assessment & Plan Note (Signed)
Suspect that he has chronotropic incompetence and suggested that he may benefit from a permanent pacemaker. He insists that his fatigue is not a problem and does not want invasive/implantation type procedures at this time.

## 2013-10-15 NOTE — Assessment & Plan Note (Signed)
Occurred in the remote past and no episodes have occurred recently, at least none that were clinically apparent. He should not be giving negative chronotropic agents or antiarrhythmics to 2 sinus bradycardia. If atrial fibrillation with rapid ventricular response becomes a problem he may need a pacemaker to allow adequate therapy.

## 2013-10-15 NOTE — Assessment & Plan Note (Signed)
The last lipid profile that I have on file is from 2013 but was excellent. He had more recent lipids performed in his primary care physician's office and will get those results

## 2013-10-15 NOTE — Assessment & Plan Note (Signed)
He should remain on lifelong warfarin anticoagulation for his history of DVT and also for atrial fibrillation. No signs of acute venous thrombosis at this time.

## 2013-10-15 NOTE — Assessment & Plan Note (Signed)
Excellent control. Avoid beta blockers and centrally acting calcium channel blockers secondary to bradycardia appear

## 2013-10-17 ENCOUNTER — Other Ambulatory Visit: Payer: Self-pay | Admitting: Cardiovascular Disease

## 2013-10-18 NOTE — Telephone Encounter (Signed)
Rx was sent to pharmacy electronically. 

## 2014-10-08 ENCOUNTER — Other Ambulatory Visit: Payer: Self-pay | Admitting: Cardiovascular Disease

## 2014-10-10 NOTE — Telephone Encounter (Signed)
Rx(s) sent to pharmacy electronically. OV 11/04/14

## 2014-10-25 ENCOUNTER — Encounter: Payer: Self-pay | Admitting: *Deleted

## 2014-11-04 ENCOUNTER — Encounter: Payer: Self-pay | Admitting: Cardiovascular Disease

## 2014-11-04 ENCOUNTER — Ambulatory Visit (INDEPENDENT_AMBULATORY_CARE_PROVIDER_SITE_OTHER): Payer: BLUE CROSS/BLUE SHIELD | Admitting: Cardiovascular Disease

## 2014-11-04 VITALS — BP 134/70 | HR 69 | Resp 16 | Ht 67.0 in | Wt 180.5 lb

## 2014-11-04 DIAGNOSIS — I2510 Atherosclerotic heart disease of native coronary artery without angina pectoris: Secondary | ICD-10-CM

## 2014-11-04 DIAGNOSIS — I495 Sick sinus syndrome: Secondary | ICD-10-CM

## 2014-11-04 DIAGNOSIS — I10 Essential (primary) hypertension: Secondary | ICD-10-CM

## 2014-11-04 DIAGNOSIS — E785 Hyperlipidemia, unspecified: Secondary | ICD-10-CM

## 2014-11-04 DIAGNOSIS — I82401 Acute embolism and thrombosis of unspecified deep veins of right lower extremity: Secondary | ICD-10-CM

## 2014-11-04 DIAGNOSIS — I480 Paroxysmal atrial fibrillation: Secondary | ICD-10-CM

## 2014-11-04 NOTE — Patient Instructions (Signed)
Your physician wants you to follow-up in: ONE YEAR with Dr.Croitoru.  You will receive a reminder letter in the mail two months in advance. If you don't receive a letter, please call our office to schedule the follow-up appointment.

## 2014-11-07 NOTE — Progress Notes (Signed)
Patient ID: Darryl Hall, male   DOB: 10-13-1925, 81 y.o.   MRN: [id number]     Reason for office visit CAD status post CABG, history of paroxysmal atrial fibrillation, history of DVT  Almost 13 years following bypass surgery, Mr. Darryl Hall feels well and continues to live independently. Only recently did he give up participation in resume the classes. 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 He lives by himself. He has not been troubled by palpitations, chest pain or shortness of breath at rest or with exertion. He denies lower extremity edema. He has not had syncope or presyncope and only occasionally develops dizziness when he bends over and stands back up.  Over the years he has had fairly marked sinus bradycardia that appears to be asymptomatic. Today's heart rate is much faster than I remember seeing it for the last few years.  He has a history of 2 previous surgeries for colon cancer, each complicated by lower extremity DVT in the right lower limb. He has an inferior vena cava filter in place and is on chronic warfarin therapy monitored at [facility] by Elliot Gault.  He has treated hypertension and hyperlipidemia. He does not have diabetes. He recently had labs with Dr. Ashby Dawes which were all "okay".  No Known Allergies  Current Outpatient Prescriptions  Medication Sig Dispense Refill  . gabapentin (NEURONTIN) 100 MG capsule Take 200 mg by mouth 2 (two) times daily.    Marland Kitchen lisinopril (PRINIVIL,ZESTRIL) 20 MG tablet TAKE ONE TABLET BY MOUTH ONCE DAILY 90 tablet 0  . lovastatin (MEVACOR) 40 MG tablet Take 40 mg by mouth at bedtime.    . Multiple Vitamins-Minerals (PRESERVISION AREDS 2 PO) Take 2 tablets by mouth daily.    . Omega-3 Fatty Acids (FISH OIL) 500 MG CAPS Take 1 capsule by mouth daily.    . traMADol (ULTRAM) 50 MG tablet Take 50 mg by mouth 2 (two) times daily.    . Warfarin Sodium (COUMADIN PO) Take by mouth 1 day or 1 dose. 3mg   S/M/W/F; 4 1/2mg  T/TH/Sat     No current facility-administered medications for this visit.    Past Medical History  Diagnosis Date  . Hernia   . colon ca dx'd 1998 & 2002    colon resection x 2   . Gum disease     or dentures  . Colon polyp   . CAD (coronary artery disease)   . PAF (paroxysmal atrial fibrillation)     H/O  . Systemic hypertension   . Dyslipidemia   . Sinus node dysfunction     Past Surgical History  Procedure Laterality Date  . Cardiac catheterization  07/08/2002    three vessel  CAD  . Cardiac bypass  07/12/2002    LIMA to LAD,SVG to first diagonal,SVG to obtuse marginal one and two, SVG to conus branch & posterior descending.  . Ventral hernia repair  2012  . Hernia repair  2012  . Colon surgery      cancer    Family History  Problem Relation Age of Onset  . Heart disease Father   . Cancer Brother     pt unaware of what kind    History   Social History  . Marital Status: Married    Spouse Name: N/A    Number of Children: N/A  . Years of Education: N/A   Occupational History  . Not on file.   Social History Main Topics  . Smoking status: Former Research scientist (life sciences)  .  Smokeless tobacco: Never Used  . Alcohol Use: No  . Drug Use: No  . Sexual Activity: Not on file   Other Topics Concern  . Not on file   Social History Narrative    Review of systems: The patient specifically denies any chest pain at rest or with exertion, dyspnea at rest or with exertion, orthopnea, paroxysmal nocturnal dyspnea, syncope, palpitations, focal neurological deficits, intermittent claudication, lower extremity edema, unexplained weight gain, cough, hemoptysis or wheezing.  The patient also denies abdominal pain, nausea, vomiting, dysphagia, diarrhea, constipation, polyuria, polydipsia, dysuria, hematuria, frequency, urgency, abnormal bleeding or bruising, fever, chills, unexpected weight changes, mood swings, change in skin or hair texture, change in voice quality,  auditory or visual problems, allergic reactions or rashes, new musculoskeletal complaints other than usual "aches and pains".   PHYSICAL EXAM BP 134/70 mmHg  Pulse 69  Ht 5\' 7"  (1.702 m)  Wt 180 lb 8 oz (81.874 kg)  BMI 28.26 kg/m2  General: Alert, oriented x3, no distress Head: no evidence of trauma, PERRL, EOMI, no exophtalmos or lid lag, no myxedema, no xanthelasma; normal ears, nose and oropharynx Neck: normal jugular venous pulsations and no hepatojugular reflux; brisk carotid pulses without delay and no carotid bruits Chest: clear to auscultation, no signs of consolidation by percussion or palpation, normal fremitus, symmetrical and full respiratory excursions Cardiovascular: normal position and quality of the apical impulse, regular rhythm, normal first and second heart sounds, no murmurs, rubs or gallops Abdomen: no tenderness or distention, no masses by palpation, no abnormal pulsatility or arterial bruits, normal bowel sounds, no hepatosplenomegaly Extremities: no clubbing, cyanosis or edema; 2+ radial, ulnar and brachial pulses bilaterally; 2+ right femoral, posterior tibial and dorsalis pedis pulses; 2+ left femoral, posterior tibial and dorsalis pedis pulses; no subclavian or femoral bruits Neurological: grossly nonfocal   EKG: Sinus rhythm with a single premature atrial beat otherwise normal  Lipid Panel November 2014 Total cholesterol 154, HDL 46, LDL 83, triglycerides 124 normal LFTs, creatinine 1.0, normal TSH   BMET    Component Value Date/Time   NA 135 04/30/2011 0525   K 3.5 04/30/2011 0525   CL 101 04/30/2011 0525   CO2 28 04/30/2011 0525   GLUCOSE 104* 04/30/2011 0525   BUN 31* 04/30/2011 0525   CREATININE 0.93 04/30/2011 0525   CALCIUM 8.4 04/30/2011 0525   GFRNONAA >60 04/30/2011 0525   GFRAA >60 04/30/2011 0525     ASSESSMENT AND PLAN  CAD s/p CABG 2003 Asymptomatic  History of recurrent DVT of lower extremity (deep venous thrombosis) He should  remain on lifelong warfarin anticoagulation for his history of DVT and also for atrial fibrillation. No signs of acute venous thrombosis at this time.  Essential hypertension Excellent control. Avoid beta blockers and centrally acting calcium channel blockers secondary to previous problems with bradycardia  Hyperlipidemia The last lipid profile that I have on file is from November 2014 but was excellent. He had more recent lipids performed in his primary care physician's office.  Paroxysmal atrial fibrillation Occurred in the remote past and no episodes have occurred recently, at least none that were clinically apparent. He should not be giving negative chronotropic agents or antiarrhythmics secondary to 2 sinus bradycardia. If atrial fibrillation with rapid ventricular response becomes a problem he may need a pacemaker to allow adequate therapy.  Sinus node dysfunction His heart rate is faster than usual. He does not need a pacemaker at this time since he has no symptoms of bradycardia.  Orders  Placed This Encounter  Procedures  . EKG 12-Lead   No orders of the defined types were placed in this encounter.    Holli Humbles, MD, [place] [phone] office [phone] pager

## 2015-01-01 ENCOUNTER — Other Ambulatory Visit: Payer: Self-pay | Admitting: Cardiovascular Disease

## 2015-01-02 NOTE — Telephone Encounter (Signed)
Rx has been sent to the pharmacy electronically. ° °

## 2015-02-24 ENCOUNTER — Other Ambulatory Visit (HOSPITAL_COMMUNITY): Payer: Self-pay | Admitting: Urology

## 2015-02-24 DIAGNOSIS — D49519 Neoplasm of unspecified behavior of unspecified kidney: Secondary | ICD-10-CM

## 2015-03-20 ENCOUNTER — Other Ambulatory Visit (HOSPITAL_COMMUNITY): Payer: BLUE CROSS/BLUE SHIELD

## 2015-03-22 ENCOUNTER — Ambulatory Visit (HOSPITAL_COMMUNITY)
Admission: RE | Admit: 2015-03-22 | Discharge: 2015-03-22 | Disposition: A | Discharge status: Home / Self Care | Payer: Medicare Other | Source: Ambulatory Visit | Attending: Urology | Admitting: Urology

## 2015-03-22 DIAGNOSIS — K7689 Other specified diseases of liver: Secondary | ICD-10-CM | POA: Diagnosis not present

## 2015-03-22 DIAGNOSIS — K8020 Calculus of gallbladder without cholecystitis without obstruction: Secondary | ICD-10-CM | POA: Insufficient documentation

## 2015-03-22 DIAGNOSIS — M5135 Other intervertebral disc degeneration, thoracolumbar region: Secondary | ICD-10-CM | POA: Diagnosis not present

## 2015-03-22 DIAGNOSIS — N289 Disorder of kidney and ureter, unspecified: Secondary | ICD-10-CM | POA: Diagnosis not present

## 2015-03-22 DIAGNOSIS — M479 Spondylosis, unspecified: Secondary | ICD-10-CM | POA: Diagnosis not present

## 2015-03-22 DIAGNOSIS — D49519 Neoplasm of unspecified behavior of unspecified kidney: Secondary | ICD-10-CM

## 2015-03-22 LAB — POCT I-STAT CREATININE: Creatinine, Ser: 1.5 mg/dL — ABNORMAL HIGH (ref 0.61–1.24)

## 2015-03-22 MED ORDER — GADOBENATE DIMEGLUMINE 529 MG/ML IV SOLN
8.0000 mL | Freq: Once | INTRAVENOUS | Status: AC | PRN
  Administered 2015-03-22: 8 mL via INTRAVENOUS

## 2015-04-04 ENCOUNTER — Other Ambulatory Visit: Payer: Self-pay | Admitting: Urology

## 2015-04-04 DIAGNOSIS — N2889 Other specified disorders of kidney and ureter: Secondary | ICD-10-CM

## 2015-04-18 ENCOUNTER — Telehealth: Payer: Self-pay | Admitting: Cardiology

## 2015-04-18 NOTE — Telephone Encounter (Signed)
Received records from [facility] for appointment on 05/17/15 with Ellen Henri, [place] .  Records given to D Chavis (medical records) for Brittany's schedule on 05/17/15. lp

## 2015-04-20 ENCOUNTER — Telehealth: Payer: Self-pay | Admitting: Cardiovascular Disease

## 2015-04-20 ENCOUNTER — Ambulatory Visit
Admission: RE | Admit: 2015-04-20 | Discharge: 2015-04-20 | Disposition: A | Discharge status: Home / Self Care | Payer: Medicare Other | Source: Ambulatory Visit | Attending: Urology | Admitting: Urology

## 2015-04-20 DIAGNOSIS — N2889 Other specified disorders of kidney and ureter: Secondary | ICD-10-CM | POA: Insufficient documentation

## 2015-04-20 HISTORY — DX: Cardiac arrhythmia, unspecified: I49.9

## 2015-04-20 HISTORY — DX: Personal history of other venous thrombosis and embolism: Z86.718

## 2015-04-20 NOTE — Telephone Encounter (Signed)
Request for surgical clearance:  1. What type of surgery is being performed? RENAL CRYOBLATION  2. When is this surgery scheduled?   AWAITING  FOR ANSWER- , WOULD TO SCHEDULE IN AUGUST 2016  3. Are there any medications that need to be held prior to surgery and how long? WARFARIN 4 DAYS PRIOR TO PROCEDURE, AND /ORWILL PATIENT REQUIRE BRIDGING   4. Name of physician performing surgery? DR Kathlene Cote  5. What is your office phone and fax number?  [facility] PHONE [phone]. ( THEY ARE ABLE TO EPIC CHART) 6.

## 2015-04-20 NOTE — Consult Note (Signed)
Chief Complaint: Enlarging right renal mass. Consultation for possible ablation.  Referring Physician(s): Nesi,Marc  History of Present Illness: Darryl Hall is an 81 y.o. male with a history of an enlarging solid right renal mass. This has been followed periodically by CT, ultrasound and MRI since 2009 and demonstrates gradual enlargement. Most recent imaging is an MRI dated 03/22/2015 demonstrating a posterior exophytic interpolar right renal mass measuring approximately 3.1 x 2.7 x 3.2 cm. This lesion demonstrates predominantly solid tissue demonstrating enhancement with a small peripheral cystic component. The lesion measured approximately 2.3 x 2.0 x 2.6 cm by CT on 03/05/2012. Imaging has demonstrated no evidence of renal vein tumor or metastatic disease.  Darryl Hall has been asymptomatic with respect to the mass and has not had any hematuria, flank pain or other symptoms.  Past Medical History  Diagnosis Date  . Hernia   . colon ca dx'd 1998 & 2002    colon resection x 2   . Gum disease     or dentures  . Colon polyp   . CAD (coronary artery disease)   . PAF (paroxysmal atrial fibrillation)     H/O  . Systemic hypertension   . Dyslipidemia   . Sinus node dysfunction   . Dysrhythmia     Paroxysmal atrial fibrillation    . History of DVT of lower extremity     Past Surgical History  Procedure Laterality Date  . Cardiac catheterization  07/08/2002    three vessel  CAD  . Cardiac bypass  07/12/2002    LIMA to LAD,SVG to first diagonal,SVG to obtuse marginal one and two, SVG to conus branch & posterior descending.  . Ventral hernia repair  2012  . Hernia repair  2012  . Colon surgery      cancer    Allergies: Review of patient's allergies indicates no known allergies.  Medications: Prior to Admission medications   Medication Sig Start Date End Date Taking? Authorizing Provider  gabapentin (NEURONTIN) 100 MG capsule Take 200 mg by mouth 2 (two) times daily.    Yes Historical Provider, MD  lisinopril (PRINIVIL,ZESTRIL) 20 MG tablet TAKE ONE TABLET BY MOUTH ONCE DAILY. 01/02/15  Yes Mihai Croitoru, MD  lovastatin (MEVACOR) 40 MG tablet Take 40 mg by mouth at bedtime.   Yes Historical Provider, MD  Multiple Vitamins-Minerals (PRESERVISION AREDS 2 PO) Take 2 tablets by mouth daily.   Yes Historical Provider, MD  Omega-3 Fatty Acids (FISH OIL) 500 MG CAPS Take 1 capsule by mouth daily.   Yes Historical Provider, MD  traMADol (ULTRAM) 50 MG tablet Take 50 mg by mouth 2 (two) times daily.   Yes Historical Provider, MD  Warfarin Sodium (COUMADIN PO) Take by mouth 1 day or 1 dose. 3mg  S/M/W/F; 4 1/2mg  T/TH/Sat   Yes Historical Provider, MD     Family History  Problem Relation Age of Onset  . Heart disease Father   . Cancer Brother     pt unaware of what kind    History   Social History  . Marital Status: Married    Spouse Name: N/A  . Number of Children: N/A  . Years of Education: N/A   Social History Main Topics  . Smoking status: Former Research scientist (life sciences)  . Smokeless tobacco: Never Used  . Alcohol Use: No  . Drug Use: No  . Sexual Activity: Not on file   Other Topics Concern  . Not on file   Social History Narrative  ECOG Status: 0 - Asymptomatic  Review of Systems: A 12 point ROS discussed and pertinent positives are indicated in the HPI above.  All other systems are negative.  Review of Systems  Constitutional: Negative.   Respiratory: Negative.   Cardiovascular: Negative.   Gastrointestinal: Negative.   Genitourinary: Negative.   Musculoskeletal: Negative.   Neurological: Negative.     Vital Signs: BP 131/62 mmHg  Pulse 68  Temp(Src) 98.1 F (36.7 C) (Oral)  Resp 13  Ht 5\' 8"  (1.727 m)  Wt 175 lb (79.379 kg)  BMI 26.61 kg/m2  SpO2 97%  Physical Exam  Constitutional: No distress.  Cardiovascular: Normal rate, regular rhythm and normal heart sounds.  Exam reveals no gallop and no friction rub.   No murmur  heard. Pulmonary/Chest: Effort normal and breath sounds normal. No respiratory distress. He has no wheezes. He has no rales.  Abdominal: Soft. Bowel sounds are normal. He exhibits no distension and no mass. There is no tenderness. There is no rebound and no guarding.  Musculoskeletal: He exhibits no edema.  Neurological: He is alert.  Skin: He is not diaphoretic.  Nursing note and vitals reviewed.   Imaging: Mr Abdomen W Wo Contrast  03/22/2015   CLINICAL DATA:  Right renal exophytic complex lesion, assessment for renal neoplasm.  EXAM: MRI ABDOMEN WITHOUT AND WITH CONTRAST  TECHNIQUE: Multiplanar multisequence MR imaging of the abdomen was performed both before and after the administration of intravenous contrast.  CONTRAST:  69mL MULTIHANCE GADOBENATE DIMEGLUMINE 529 MG/ML IV SOLN  COMPARISON:  Multiple exams, including 03/28/2014 and 03/05/2012.  FINDINGS: Despite efforts by the technologist and patient, motion artifact is present on today's exam and could not be eliminated. This reduces exam sensitivity and specificity.  Lower chest:  Unremarkable  Hepatobiliary: Multiple small hypodense lesions in the liver do not appear to enhance and are most compatible with cysts. An index lesion in segment 7 measures 6 mm in diameter.  Small gallstones are present dependently in the gallbladder, image 30 series 11. No biliary dilatation.  Pancreas: Unremarkable  Spleen: Unremarkable  Adrenals/Urinary Tract: Posterior right mid kidney exophytic 3.0 by 2.5 cm lesion with mixed T1 and T2 signal characteristics, but clearly enhancing on subtraction images and manual measurements. This lesion measured 1.9 by 2.2 cm on 04/25/2008. Slightly cystic elements laterally.  Additional small bilateral renal cysts noted.  Stomach/Bowel: Unremarkable  Vascular/Lymphatic: IVC filter.  Other: No supplemental non-categorized findings.  Musculoskeletal: Thoracolumbar spondylosis and degenerative disc disease.  IMPRESSION: 1. Slow  growing enhancing mass of the right posterior kidney, currently measuring at 3.0 by 2.5 cm. Although benign etiologies such as oncocytoma might be considered given the slow growth (it has slightly more than doubled in volume over the past 7 years), this mass is not distinguishable from a renal cell carcinoma currently and may represent a slow growing renal cell carcinoma. Consider ablation or resection. 2. No findings of metastatic disease or tumor thrombus in the left renal vein. The patient does have an IVC filter. 3. Scattered small hepatic and renal cysts are also present.   Electronically Signed   By: Van Clines M.D.   On: 03/22/2015 11:14    Labs:  CBC: No results for input(s): WBC, HGB, HCT, PLT in the last 8760 hours.  COAGS: No results for input(s): INR, APTT in the last 8760 hours.  BMP:  Recent Labs  03/22/15 0926  CREATININE 1.50*    LIVER FUNCTION TESTS: No results for input(s): BILITOT, AST, ALT, ALKPHOS,  PROT, ALBUMIN in the last 8760 hours.  TUMOR MARKERS: No results for input(s): AFPTM, CEA, CA199, CHROMGRNA in the last 8760 hours.  Assessment and Plan:  I reviewed imaging findings with Darryl Hall. The enhancing tumor of the right kidney most likely represents a carcinoma and demonstrates slow growth over time. Based on size and location, the tumor is amenable to percutaneous cryoablation treatment. I reviewed details of cryoablation with Darryl Hall including risks. Biopsy can be performed at the time of ablation and does not need to be performed prior to ablation.  Most recent creatinine was mildly elevated at 1.5 with previous baseline of 0.9. The patient states that he has been staying better hydrated over the last month or so and that this may have been due to relative dehydration. Given the exophytic nature of this lesion, I told Darryl Hall that ablation would have little, if any, negative impact on renal function.  Given his history of prior CABG and  atrial fibrillation, we will proceed with obtaining formal cardiac clearance from Dr. Sallyanne Kuster, his regular cardiologist. He is currently in sinus rhythm. He has a history of DVT and IVC filter placement and is on chronic Coumadin. He has not had any recent venous thromboembolic disease and most likely will not require any bridging therapy with Lovenox. We will consult Dr. Sallyanne Kuster about this. The patient would like to schedule treatment sometime in August. We will begin the scheduling process. The procedure will be performed at [facility] under general anesthesia with overnight hospital stay.  SignedAletta Edouard T 04/20/2015, 1:27 PM     I spent a total of 40 Minutes in face to face in clinical consultation, greater than 50% of which was counseling/coordinating care for treatment of a right renal mass.

## 2015-04-20 NOTE — Telephone Encounter (Signed)
He is cleared for renal cyoblation from a cardiac standpoint. He may stop coumadin for 5 days prior but should have bridging with history of recurrent DVTs and Afib. This will need to be managed by [facility] since they manage his coumadin.  Alexes Menchaca Martinique MD, [facility]

## 2015-04-21 NOTE — Telephone Encounter (Signed)
Spoke to [place] at [employer] She was made aware of cardiac clearance and to contact [facility] (Dr Mathis Fare office) concerning bridging.

## 2015-04-24 ENCOUNTER — Other Ambulatory Visit: Payer: Self-pay | Admitting: Interventional Radiology

## 2015-04-24 DIAGNOSIS — N2889 Other specified disorders of kidney and ureter: Secondary | ICD-10-CM

## 2015-05-05 NOTE — Progress Notes (Signed)
Called for orders Radiology surgery 05-19-15 pre op 05-17-15 Thanks

## 2015-05-12 ENCOUNTER — Other Ambulatory Visit: Payer: Self-pay | Admitting: Radiology

## 2015-05-12 NOTE — Progress Notes (Signed)
Left messsage on voice mail of IR , PA and requested orders.

## 2015-05-17 ENCOUNTER — Ambulatory Visit (INDEPENDENT_AMBULATORY_CARE_PROVIDER_SITE_OTHER): Payer: Medicare Other | Admitting: Cardiology

## 2015-05-17 ENCOUNTER — Encounter: Payer: Self-pay | Admitting: Cardiology

## 2015-05-17 ENCOUNTER — Other Ambulatory Visit: Payer: Self-pay | Admitting: Radiology

## 2015-05-17 ENCOUNTER — Encounter (HOSPITAL_COMMUNITY): Payer: Self-pay

## 2015-05-17 ENCOUNTER — Encounter (HOSPITAL_COMMUNITY)
Admission: RE | Admit: 2015-05-17 | Discharge: 2015-05-17 | Disposition: A | Discharge status: Home / Self Care | Payer: Medicare Other | Source: Ambulatory Visit | Attending: Interventional Radiology | Admitting: Interventional Radiology

## 2015-05-17 VITALS — BP 117/76 | HR 56 | Ht 68.0 in | Wt 176.1 lb

## 2015-05-17 DIAGNOSIS — Z7901 Long term (current) use of anticoagulants: Secondary | ICD-10-CM | POA: Diagnosis not present

## 2015-05-17 DIAGNOSIS — I480 Paroxysmal atrial fibrillation: Secondary | ICD-10-CM | POA: Diagnosis not present

## 2015-05-17 DIAGNOSIS — E785 Hyperlipidemia, unspecified: Secondary | ICD-10-CM | POA: Diagnosis not present

## 2015-05-17 DIAGNOSIS — I2510 Atherosclerotic heart disease of native coronary artery without angina pectoris: Secondary | ICD-10-CM

## 2015-05-17 DIAGNOSIS — Z86718 Personal history of other venous thrombosis and embolism: Secondary | ICD-10-CM | POA: Diagnosis not present

## 2015-05-17 DIAGNOSIS — N2889 Other specified disorders of kidney and ureter: Secondary | ICD-10-CM | POA: Diagnosis present

## 2015-05-17 DIAGNOSIS — C189 Malignant neoplasm of colon, unspecified: Secondary | ICD-10-CM | POA: Diagnosis not present

## 2015-05-17 DIAGNOSIS — C649 Malignant neoplasm of unspecified kidney, except renal pelvis: Secondary | ICD-10-CM | POA: Diagnosis not present

## 2015-05-17 DIAGNOSIS — I10 Essential (primary) hypertension: Secondary | ICD-10-CM | POA: Diagnosis not present

## 2015-05-17 HISTORY — DX: Unspecified osteoarthritis, unspecified site: M19.90

## 2015-05-17 HISTORY — DX: Angina pectoris, unspecified: I20.9

## 2015-05-17 LAB — CBC WITH DIFFERENTIAL/PLATELET
Basophils Absolute: 0 10*3/uL (ref 0.0–0.1)
Basophils Relative: 0 % (ref 0–1)
Eosinophils Absolute: 0.2 10*3/uL (ref 0.0–0.7)
Eosinophils Relative: 3 % (ref 0–5)
HCT: 40.9 % (ref 39.0–52.0)
Hemoglobin: 12.9 g/dL — ABNORMAL LOW (ref 13.0–17.0)
Lymphocytes Relative: 16 % (ref 12–46)
Lymphs Abs: 1.1 10*3/uL (ref 0.7–4.0)
MCH: 29.3 pg (ref 26.0–34.0)
MCHC: 31.5 g/dL (ref 30.0–36.0)
MCV: 93 fL (ref 78.0–100.0)
Monocytes Absolute: 0.7 10*3/uL (ref 0.1–1.0)
Monocytes Relative: 10 % (ref 3–12)
Neutro Abs: 4.8 10*3/uL (ref 1.7–7.7)
Neutrophils Relative %: 71 % (ref 43–77)
Platelets: 185 10*3/uL (ref 150–400)
RBC: 4.4 MIL/uL (ref 4.22–5.81)
RDW: 13.6 % (ref 11.5–15.5)
WBC: 6.8 10*3/uL (ref 4.0–10.5)

## 2015-05-17 LAB — BASIC METABOLIC PANEL
Anion gap: 6 (ref 5–15)
BUN: 25 mg/dL — ABNORMAL HIGH (ref 6–20)
CO2: 27 mmol/L (ref 22–32)
Calcium: 8.9 mg/dL (ref 8.9–10.3)
Chloride: 105 mmol/L (ref 101–111)
Creatinine, Ser: 1.28 mg/dL — ABNORMAL HIGH (ref 0.61–1.24)
GFR calc Af Amer: 56 mL/min — ABNORMAL LOW (ref >60)
GFR calc non Af Amer: 49 mL/min — ABNORMAL LOW (ref >60)
Glucose, Bld: 96 mg/dL (ref 65–99)
Potassium: 5 mmol/L (ref 3.5–5.1)
Sodium: 138 mmol/L (ref 135–145)

## 2015-05-17 LAB — PROTIME-INR
INR: 1.78 — ABNORMAL HIGH (ref 0.00–1.49)
Prothrombin Time: 20.7 seconds — ABNORMAL HIGH (ref 11.6–15.2)

## 2015-05-17 LAB — ABO/RH: ABO/RH(D): O POS

## 2015-05-17 NOTE — Progress Notes (Signed)
05/17/2015 Darryl Hall   1926-02-05  [id number]  Primary Physician Merrilee Seashore, MD Primary Cardiologist: Dr. Sallyanne Kuster Electrophysiologist: N/A  Reason for Visit/CC:  Surgical Clearance  HPI:  81 y/o male followed by Dr. Sallyanne Kuster. He has a h/o CAD s/p CABG 13 years ago, PAF and history of lower extremity DVT s/p IVC filter. He is on chronic warfarin therapy and his INRs are followed at [facility]. He also has treated HTN and HLD, followed by his PCP. He was recently diagnosed with a enlarging solid right renal mass, felt to be possible carcinoma and is scheduled for percutaneous cryoablation with [facility] Radiology.   He presents for cardiac clearance. He denies any recent history of angina or dyspnea. No limitation or symptoms with physical activity. His EKG shows mild sinus bradycardia for which he is currently asymptomatic. His EKG shows no signs of ischemia and is unchanged compared to prior. He denies any syncope/ near syncope, palpitations, orthopnea, PND or LEE. He does recall an episode of dizziness several weeks ago which he attributes to dehydration. Symptoms improved with oral hydration. BP today is well controlled/normotensive. Orthostatic vital signs are normal.     Current Outpatient Prescriptions  Medication Sig Dispense Refill  . Calcium Carb-Cholecalciferol (CALCIUM 600 + D PO) Take 1 tablet by mouth 2 (two) times daily.    Marland Kitchen gabapentin (NEURONTIN) 100 MG capsule Take 200 mg by mouth 2 (two) times daily.    Marland Kitchen lisinopril (PRINIVIL,ZESTRIL) 20 MG tablet TAKE ONE TABLET BY MOUTH ONCE DAILY. (Patient taking differently: TAKE 10MG  ONCE DAILY) 90 tablet 2  . lovastatin (MEVACOR) 20 MG tablet Take 40 mg by mouth 2 (two) times daily.    . Multiple Vitamins-Minerals (PRESERVISION AREDS 2 PO) Take 1 tablet by mouth 2 (two) times daily.     . Omega-3 Fatty Acids (FISH OIL) 500 MG CAPS Take 1 capsule by mouth daily.    . traMADol (ULTRAM) 50 MG tablet Take 50 mg by  mouth 2 (two) times daily.    Marland Kitchen warfarin (COUMADIN) 3 MG tablet Take 3-4.5 mg by mouth daily. Takes 3 mg on Sunday, Monday, Wednesday, and Friday. Takes 4.5mg  on Tuesday, Thursday and Saturday     No current facility-administered medications for this visit.    No Known Allergies  Social History   Social History  . Marital Status: Married    Spouse Name: N/A  . Number of Children: N/A  . Years of Education: N/A   Occupational History  . Not on file.   Social History Main Topics  . Smoking status: Former Research scientist (life sciences)  . Smokeless tobacco: Never Used  . Alcohol Use: No  . Drug Use: No  . Sexual Activity: Not on file   Other Topics Concern  . Not on file   Social History Narrative     Review of Systems: General: negative for chills, fever, night sweats or weight changes.  Cardiovascular: negative for chest pain, dyspnea on exertion, edema, orthopnea, palpitations, paroxysmal nocturnal dyspnea or shortness of breath Dermatological: negative for rash Respiratory: negative for cough or wheezing Urologic: negative for hematuria Abdominal: negative for nausea, vomiting, diarrhea, bright red blood per rectum, melena, or hematemesis Neurologic: negative for visual changes, syncope, or dizziness All other systems reviewed and are otherwise negative except as noted above.    Blood pressure 117/76, pulse 56, height 5\' 8"  (1.727 m), weight 176 lb 1.6 oz (79.878 kg).  General appearance: alert, cooperative and no distress Neck: no carotid bruit and no JVD Lungs:  clear to auscultation bilaterally Heart: regular rate and rhythm, S1, S2 normal, no murmur, click, rub or gallop Extremities: no LEE Pulses: 2+ and symmetric Skin: warm and dry Neurologic: Grossly normal  EKG sinus bradycardia. HR 56 bpm. No ischemia  ASSESSMENT AND PLAN:   1. CAD: h/o remote CABG. Stable w/o angina. EKG nonischemic and unchanged compared to prior. Continue medical therapy.  2. PAF: SR on EKG w/o  symptoms of breakthrough Afib. Rate is well controlled. OK to hold warfarin temporarily for planned procedure.  3. H/O DVT: s/p IVC filter. OK to hold warfarin temporarily for planned procedure.  4. Right Renal Tumor: scheduled for cryoablation. He denies any anginal symptoms and no EKG abnormalities. OK to proceed with procedure. No ischemic w/u indicated prior to surgery.    PLAN  Ok to proceed with surgery. Resume warfarin once cleared by Radiology. F/u with [facility] for INR assessment post procedure. F/u with Dr. Sallyanne Kuster in 6 months.   Nadyne Gariepy PA-C 05/17/2015 11:02 AM

## 2015-05-17 NOTE — Progress Notes (Signed)
05-17-15 - Talked to Dr. Cheral Bay nurse at [facility].  I read the telephone note from Dr. Martinique regarding the pt. To be bridging with Lovenox (pt. Stopped coumadin on August 7 as instructed) but pt. Did not say anything about lovenox.  She said the last note she had on April 27, 2015, stated that he did not need to bridge with Lovenox.  Also office visit note from 05-17-15 - Mare Loan, PA, did not mention Lovenox.

## 2015-05-17 NOTE — Progress Notes (Signed)
11-04-14 - LOV Dr. Loleta Chance - cardio - [facility]  (hx. CABG , [place] filter in leg)

## 2015-05-17 NOTE — Progress Notes (Signed)
05-17-15 - Faxed PT and BMP lab results from preop visit on 05-17-15 to Dr. Kathlene Cote via [facility]

## 2015-05-17 NOTE — Patient Instructions (Addendum)
Manus Weedman  05/17/2015   Your procedure is scheduled on:  May 19, 2015  Report to [facility] Main  Entrance and check in to Radiology at 6:30 AM.  Call this number if you have problems the morning of surgery [phone]   Remember: ONLY 1 PERSON MAY GO WITH YOU TO SHORT STAY TO GET  [place].  Do not eat food or drink liquids :After Midnight.     Take these medicines the morning of surgery with A SIP OF WATER: Gabapentin,                                You may not have any metal on your body including hair pins and              piercings  Do not wear jewelry, make-up, lotions, powders or perfumes, deodorant          .              Men may shave face and neck.   Do not bring valuables to the hospital. [place].  Contacts, dentures or bridgework may not be worn into surgery.  Leave suitcase in the car. After surgery it may be brought to your room.    Special Instructions:  Coughing and deep breathing exercises, leg exercises              Please read over the following fact sheets you were given: _____________________________________________________________________             [facility] - Preparing for Surgery Before surgery, you can play an important role.  Because skin is not sterile, your skin needs to be as free of germs as possible.  You can reduce the number of germs on your skin by washing with CHG (chlorahexidine gluconate) soap before surgery.  CHG is an antiseptic cleaner which kills germs and bonds with the skin to continue killing germs even after washing. Please DO NOT use if you have an allergy to CHG or antibacterial soaps.  If your skin becomes reddened/irritated stop using the CHG and inform your nurse when you arrive at Short Stay. Do not shave (including legs and underarms) for at least 48 hours prior to the first CHG shower.  You may shave your  face/neck. Please follow these instructions carefully:  1.  Shower with CHG Soap the night before surgery and the  morning of Surgery.  2.  If you choose to wash your hair, wash your hair first as usual with your  normal  shampoo.  3.  After you shampoo, rinse your hair and body thoroughly to remove the  shampoo.                           4.  Use CHG as you would any other liquid soap.  You can apply chg directly  to the skin and wash                       Gently with a scrungie or clean washcloth.  5.  Apply the CHG Soap to your body ONLY FROM THE NECK DOWN.   Do not use on face/  open                           Wound or open sores. Avoid contact with eyes, ears mouth and genitals (private parts).                       Wash face,  Genitals (private parts) with your normal soap.             6.  Wash thoroughly, paying special attention to the area where your surgery  will be performed.  7.  Thoroughly rinse your body with warm water from the neck down.  8.  DO NOT shower/wash with your normal soap after using and rinsing off  the CHG Soap.                9.  Pat yourself dry with a clean towel.            10.  Wear clean pajamas.            11.  Place clean sheets on your bed the night of your first shower and do not  sleep with pets. Day of Surgery : Do not apply any lotions/deodorants the morning of surgery.  Please wear clean clothes to the hospital/surgery center.  FAILURE TO FOLLOW THESE INSTRUCTIONS MAY RESULT IN THE CANCELLATION OF YOUR SURGERY PATIENT SIGNATURE_________________________________  NURSE SIGNATURE__________________________________  ________________________________________________________________________

## 2015-05-17 NOTE — Progress Notes (Addendum)
05-17-15 - called IR  And talked to [facility] regarding pt. Explained that in cardio note of 04-20-15, states that pt. To be bridge with Lovenox.  Pt. Stopped Coumadin on 05-14-15 and he did not mention anything about Lovenox.  Tiffany said that is not managed by them.  She gave me Lennette Bihari (PA) pager number.  Paged him at [phone] and again at [phone].    Talked to Lennette Bihari and he asked me to call [facility] to clarify.

## 2015-05-17 NOTE — Patient Instructions (Signed)
Your physician wants you to follow-up in: 6 months with Dr. Sallyanne Kuster. You will receive a reminder letter in the mail two months in advance. If you don't receive a letter, please call our office to schedule the follow-up appointment.

## 2015-05-17 NOTE — Progress Notes (Addendum)
04-20-15 - cardio note from Dr. Peter Martinique - surgical clearance (pt. To be bridged with Lovenox) - [facility] 11-04-14 - EKG - [facility]  05-17-15 - LOV - Mare Loan, PA (cardio) - [facility]

## 2015-05-18 ENCOUNTER — Other Ambulatory Visit: Payer: Self-pay | Admitting: Radiology

## 2015-05-19 ENCOUNTER — Other Ambulatory Visit: Payer: Self-pay

## 2015-05-19 ENCOUNTER — Observation Stay (HOSPITAL_COMMUNITY)
Admission: RE | Admit: 2015-05-19 | Discharge: 2015-05-20 | Disposition: A | Discharge status: Home / Self Care | Payer: Medicare Other | Source: Ambulatory Visit | Attending: Interventional Radiology | Admitting: Interventional Radiology

## 2015-05-19 ENCOUNTER — Ambulatory Visit (HOSPITAL_COMMUNITY): Payer: Medicare Other | Admitting: Certified Registered"

## 2015-05-19 ENCOUNTER — Ambulatory Visit (HOSPITAL_COMMUNITY)
Admission: RE | Admit: 2015-05-19 | Discharge: 2015-05-19 | Disposition: A | Discharge status: Home / Self Care | Payer: Medicare Other | Source: Ambulatory Visit | Attending: Interventional Radiology | Admitting: Interventional Radiology

## 2015-05-19 ENCOUNTER — Encounter (HOSPITAL_COMMUNITY): Payer: Self-pay

## 2015-05-19 ENCOUNTER — Encounter (HOSPITAL_COMMUNITY)
Admission: RE | Disposition: A | Discharge status: Home / Self Care | Payer: Self-pay | Source: Ambulatory Visit | Attending: Interventional Radiology

## 2015-05-19 DIAGNOSIS — C641 Malignant neoplasm of right kidney, except renal pelvis: Secondary | ICD-10-CM | POA: Insufficient documentation

## 2015-05-19 DIAGNOSIS — I2510 Atherosclerotic heart disease of native coronary artery without angina pectoris: Secondary | ICD-10-CM

## 2015-05-19 DIAGNOSIS — Z86718 Personal history of other venous thrombosis and embolism: Secondary | ICD-10-CM | POA: Insufficient documentation

## 2015-05-19 DIAGNOSIS — Z09 Encounter for follow-up examination after completed treatment for conditions other than malignant neoplasm: Secondary | ICD-10-CM

## 2015-05-19 DIAGNOSIS — Z85038 Personal history of other malignant neoplasm of large intestine: Secondary | ICD-10-CM

## 2015-05-19 DIAGNOSIS — H5440 Blindness, one eye, unspecified eye: Secondary | ICD-10-CM

## 2015-05-19 DIAGNOSIS — I480 Paroxysmal atrial fibrillation: Secondary | ICD-10-CM

## 2015-05-19 DIAGNOSIS — Z79899 Other long term (current) drug therapy: Secondary | ICD-10-CM

## 2015-05-19 DIAGNOSIS — Z87891 Personal history of nicotine dependence: Secondary | ICD-10-CM | POA: Insufficient documentation

## 2015-05-19 DIAGNOSIS — C649 Malignant neoplasm of unspecified kidney, except renal pelvis: Secondary | ICD-10-CM | POA: Diagnosis not present

## 2015-05-19 DIAGNOSIS — Z7901 Long term (current) use of anticoagulants: Secondary | ICD-10-CM | POA: Insufficient documentation

## 2015-05-19 DIAGNOSIS — C189 Malignant neoplasm of colon, unspecified: Secondary | ICD-10-CM | POA: Insufficient documentation

## 2015-05-19 DIAGNOSIS — I495 Sick sinus syndrome: Secondary | ICD-10-CM

## 2015-05-19 DIAGNOSIS — I10 Essential (primary) hypertension: Secondary | ICD-10-CM | POA: Insufficient documentation

## 2015-05-19 DIAGNOSIS — E785 Hyperlipidemia, unspecified: Secondary | ICD-10-CM

## 2015-05-19 DIAGNOSIS — N2889 Other specified disorders of kidney and ureter: Secondary | ICD-10-CM

## 2015-05-19 DIAGNOSIS — Z951 Presence of aortocoronary bypass graft: Secondary | ICD-10-CM | POA: Insufficient documentation

## 2015-05-19 LAB — PROTIME-INR
INR: 1.15 (ref 0.00–1.49)
Prothrombin Time: 14.9 seconds (ref 11.6–15.2)

## 2015-05-19 SURGERY — RADIOLOGY WITH ANESTHESIA
Anesthesia: General | Laterality: Right

## 2015-05-19 MED ORDER — DOCUSATE SODIUM 100 MG PO CAPS
100.0000 mg | ORAL_CAPSULE | Freq: Two times a day (BID) | ORAL | Status: DC
  Administered 2015-05-19 – 2015-05-20 (×2): 100 mg via ORAL
  Filled 2015-05-19 (×2): qty 1

## 2015-05-19 MED ORDER — LISINOPRIL 10 MG PO TABS
10.0000 mg | ORAL_TABLET | Freq: Every day | ORAL | Status: DC
Start: 2015-05-20 — End: 2015-05-20
  Administered 2015-05-20: 10 mg via ORAL
  Filled 2015-05-19: qty 1

## 2015-05-19 MED ORDER — ONDANSETRON HCL 4 MG/2ML IJ SOLN
4.0000 mg | Freq: Four times a day (QID) | INTRAMUSCULAR | Status: DC | PRN
  Administered 2015-05-19: 4 mg via INTRAVENOUS
  Filled 2015-05-19: qty 2

## 2015-05-19 MED ORDER — FENTANYL CITRATE (PF) 250 MCG/5ML IJ SOLN
INTRAMUSCULAR | Status: AC
  Filled 2015-05-19: qty 25

## 2015-05-19 MED ORDER — NEOSTIGMINE METHYLSULFATE 10 MG/10ML IV SOLN
INTRAVENOUS | Status: DC | PRN
Start: 2015-05-19 — End: 2015-05-19
  Administered 2015-05-19: 4 mg via INTRAVENOUS

## 2015-05-19 MED ORDER — PHENYLEPHRINE HCL 10 MG/ML IJ SOLN
INTRAMUSCULAR | Status: DC | PRN
  Administered 2015-05-19: 40 ug via INTRAVENOUS
  Administered 2015-05-19 (×2): 80 ug via INTRAVENOUS

## 2015-05-19 MED ORDER — PRAVASTATIN SODIUM 20 MG PO TABS
20.0000 mg | ORAL_TABLET | Freq: Every day | ORAL | Status: DC
  Administered 2015-05-19: 20 mg via ORAL
  Filled 2015-05-19: qty 1

## 2015-05-19 MED ORDER — EPHEDRINE SULFATE 50 MG/ML IJ SOLN
INTRAMUSCULAR | Status: DC | PRN
  Administered 2015-05-19 (×2): 10 mg via INTRAVENOUS
  Administered 2015-05-19: 5 mg via INTRAVENOUS
  Administered 2015-05-19: 10 mg via INTRAVENOUS

## 2015-05-19 MED ORDER — GLYCOPYRROLATE 0.2 MG/ML IJ SOLN
INTRAMUSCULAR | Status: DC | PRN
  Administered 2015-05-19: 0.6 mg via INTRAVENOUS

## 2015-05-19 MED ORDER — GABAPENTIN 100 MG PO CAPS
200.0000 mg | ORAL_CAPSULE | Freq: Two times a day (BID) | ORAL | Status: DC
  Administered 2015-05-19 – 2015-05-20 (×2): 200 mg via ORAL
  Filled 2015-05-19 (×2): qty 2

## 2015-05-19 MED ORDER — ROCURONIUM BROMIDE 100 MG/10ML IV SOLN
INTRAVENOUS | Status: AC
  Filled 2015-05-19: qty 1

## 2015-05-19 MED ORDER — ONDANSETRON HCL 4 MG/2ML IJ SOLN
INTRAMUSCULAR | Status: DC | PRN
  Administered 2015-05-19: 4 mg via INTRAVENOUS

## 2015-05-19 MED ORDER — FENTANYL CITRATE (PF) 100 MCG/2ML IJ SOLN: 25.0000 ug | INTRAMUSCULAR | Status: DC | PRN

## 2015-05-19 MED ORDER — ROCURONIUM BROMIDE 100 MG/10ML IV SOLN
INTRAVENOUS | Status: DC | PRN
  Administered 2015-05-19: 10 mg via INTRAVENOUS
  Administered 2015-05-19: 5 mg via INTRAVENOUS
  Administered 2015-05-19: 35 mg via INTRAVENOUS

## 2015-05-19 MED ORDER — PROPOFOL 10 MG/ML IV BOLUS
INTRAVENOUS | Status: AC
Start: 2015-05-19 — End: 2015-05-19
  Filled 2015-05-19: qty 20

## 2015-05-19 MED ORDER — CEFAZOLIN SODIUM-DEXTROSE 2-3 GM-% IV SOLR
2.0000 g | Freq: Once | INTRAVENOUS | Status: AC
  Administered 2015-05-19: 2 g via INTRAVENOUS
  Filled 2015-05-19: qty 50

## 2015-05-19 MED ORDER — LIDOCAINE HCL (CARDIAC) 20 MG/ML IV SOLN
INTRAVENOUS | Status: AC
  Filled 2015-05-19: qty 5

## 2015-05-19 MED ORDER — SENNOSIDES-DOCUSATE SODIUM 8.6-50 MG PO TABS
1.0000 | ORAL_TABLET | Freq: Every day | ORAL | Status: DC | PRN
  Administered 2015-05-20: 1 via ORAL
  Filled 2015-05-19: qty 1

## 2015-05-19 MED ORDER — PROPOFOL 10 MG/ML IV BOLUS
INTRAVENOUS | Status: DC | PRN
  Administered 2015-05-19: 150 mg via INTRAVENOUS

## 2015-05-19 MED ORDER — HYDROCODONE-ACETAMINOPHEN 5-325 MG PO TABS
1.0000 | ORAL_TABLET | ORAL | Status: DC | PRN
  Administered 2015-05-19: 1 via ORAL
  Administered 2015-05-20: 2 via ORAL
  Administered 2015-05-20: 1 via ORAL
  Filled 2015-05-19: qty 1
  Filled 2015-05-19: qty 2
  Filled 2015-05-19 (×2): qty 1

## 2015-05-19 MED ORDER — LIDOCAINE HCL (CARDIAC) 20 MG/ML IV SOLN
INTRAVENOUS | Status: DC | PRN
  Administered 2015-05-19: 50 mg via INTRAVENOUS

## 2015-05-19 MED ORDER — DEXAMETHASONE SODIUM PHOSPHATE 10 MG/ML IJ SOLN
INTRAMUSCULAR | Status: DC | PRN
  Administered 2015-05-19: 10 mg via INTRAVENOUS

## 2015-05-19 MED ORDER — FENTANYL CITRATE (PF) 100 MCG/2ML IJ SOLN
INTRAMUSCULAR | Status: DC | PRN
  Administered 2015-05-19: 100 ug via INTRAVENOUS
  Administered 2015-05-19: 50 ug via INTRAVENOUS

## 2015-05-19 MED ORDER — LACTATED RINGERS IV SOLN
INTRAVENOUS | Status: DC
  Administered 2015-05-19 (×2): via INTRAVENOUS

## 2015-05-19 NOTE — Anesthesia Postprocedure Evaluation (Signed)
  Anesthesia Post-op Note  Patient: Darryl Hall  Procedure(s) Performed: Procedure(s) (LRB): RIGHT RENAL CRYO ABLATION AND BIOPSY (Right)  Patient Location: PACU  Anesthesia Type: General  Level of Consciousness: awake and alert   Airway and Oxygen Therapy: Patient Spontanous Breathing  Post-op Pain: mild  Post-op Assessment: Post-op Vital signs reviewed, Patient's Cardiovascular Status Stable, Respiratory Function Stable, Patent Airway and No signs of Nausea or vomiting  Last Vitals:  Filed Vitals:   05/19/15 1315  BP: 144/85  Pulse: 48  Temp: 36.5 C  Resp: 17    Post-op Vital Signs: stable   Complications: No apparent anesthesia complications

## 2015-05-19 NOTE — Procedures (Signed)
Interventional Radiology Procedure Note  Procedure:  CT guided core biopsy and cryoablation of right renal mass  Anesthesia:  General  Complications:  None  Estimated Blood Loss: <25 mL  Findings:  3 cm exophytic posterior, interpolar right renal mass sampled with 18 G core biopsy x 2 via 17 g needle. Percutaneous cryoablation performed via 4 Galil Ice Rod probes. Adequate ice ball formation encompassing lesion.  Plan:  Overnight observation.  Check labs in AM.  Eulas Post T. Kathlene Cote, M.D Pager:  [phone]

## 2015-05-19 NOTE — Progress Notes (Signed)
Patient ID: Darryl Hall, male   DOB: 1926/04/01, 81 y.o.   MRN: [id number]    Referring Physician(s): Nesi,M  Chief Complaint:  Right renal mass  Subjective:  Pt without new c/o; denies CP, dyspnea, abd/back/flank pain, N/V; does have hematuria  Allergies: Review of patient's allergies indicates no known allergies.  Medications: Prior to Admission medications   Medication Sig Start Date End Date Taking? Authorizing Provider  Calcium Carb-Cholecalciferol (CALCIUM 600 + D PO) Take 1 tablet by mouth 2 (two) times daily.   Yes Historical Provider, MD  gabapentin (NEURONTIN) 100 MG capsule Take 200 mg by mouth 2 (two) times daily.   Yes Historical Provider, MD  lisinopril (PRINIVIL,ZESTRIL) 20 MG tablet TAKE ONE TABLET BY MOUTH ONCE DAILY. Patient taking differently: TAKE 10MG  ONCE DAILY 01/02/15  Yes Mihai Croitoru, MD  lovastatin (MEVACOR) 20 MG tablet Take 40 mg by mouth 2 (two) times daily.   Yes Historical Provider, MD  Multiple Vitamins-Minerals (PRESERVISION AREDS 2 PO) Take 1 tablet by mouth 2 (two) times daily.    Yes Historical Provider, MD  Omega-3 Fatty Acids (FISH OIL) 500 MG CAPS Take 1 capsule by mouth daily.   Yes Historical Provider, MD  traMADol (ULTRAM) 50 MG tablet Take 50 mg by mouth 2 (two) times daily.   Yes Historical Provider, MD  warfarin (COUMADIN) 3 MG tablet Take 3-4.5 mg by mouth daily. Takes 3 mg on Sunday, Monday, Wednesday, and Friday. Takes 4.5mg  on Tuesday, Thursday and Saturday   Yes Historical Provider, MD     Vital Signs: BP 136/57 mmHg  Pulse 51  Temp(Src) 97.3 F (36.3 C) (Oral)  Resp 16  Ht 5\' 8"  (1.727 m)  Wt 176 lb 1.6 oz (79.878 kg)  BMI 26.78 kg/m2  SpO2 94%  Physical Exam awake/alert; puncture site rt flank clean and dry, NT, no discrete hematoma; foley with blood-tinged urine in bag  Imaging: Ct Guide Tissue Ablation  05/19/2015   CLINICAL DATA:  Enlarging and solid right renal mass measuring 3.2 cm. The patient presents for  planned biopsy and percutaneous cryoablation of the neoplasm.  EXAM: CT-GUIDED CORE BIOPSY OF RIGHT RENAL MASS  CT-GUIDED PERCUTANEOUS CRYOABLATION OF RIGHT RENAL MASS  ANESTHESIA/SEDATION: General  MEDICATIONS: 2 g IV Ancef. As antibiotic prophylaxis, Ancef was ordered pre-procedure and administered intravenously within one hour of incision.  CONTRAST:  None  PROCEDURE: The procedure, risks, benefits, and alternatives were explained to the patient. Questions regarding the procedure were encouraged and answered. The patient understands and consents to the procedure.  The patient was placed under general anesthesia. Initial unenhanced CT was performed in a prone position to localize the right renal mass. A time-out was performed prior to the procedure.  The right flank region was prepped with Betadine in a sterile fashion, and a sterile drape was applied covering the operative field. A sterile gown and sterile gloves were used for the procedure.  A 17 gauge trocar needle was advanced into the right renal mass. Core biopsy was performed with an 18 gauge automated core biopsy device. A total of 2 core samples were submitted in formalin for pathologic analysis.  Under CT guidance, a series of 4 Galil Ice Rod cryoablation probes were advanced into the right renal mass. Probe positioning was confirmed by CT prior to cryoablation.  Cryoablation was performed through the 4 probes simultaneously. Initial 10 minute cycle of cryoablation was performed. This was followed by a 8 minute thaw cycle. A second 10 minute cycle of cryoablation was  then performed. During ablation, periodic CT imaging was performed to monitor ice ball formation and morphology. After active thaw, the cryoablation probes were removed.  Post-procedural CT was performed.  COMPLICATIONS: None  FINDINGS: Initial unenhanced CT demonstrates an exophytic posterior interpolar renal mass measuring approximately 2.8 x 3.3 cm in greatest dimensions by axial imaging.  Biopsy yielded solid tissue. The lesion is partially calcified posteriorly.  Monitoring during cryoablation demonstrates adequate ice ball formation encompassing the entire mass. There were no immediate bleeding complications. Imaging at the lung bases shows no evidence of pneumothorax following the procedure.  IMPRESSION: CT guided percutaneous core biopsy and cryoablation of a 3.2 cm right renal mass. The patient will be observed overnight.   Electronically Signed   By: Aletta Edouard M.D.   On: 05/19/2015 13:07   Ct Biopsy  05/19/2015   CLINICAL DATA:  Enlarging and solid right renal mass measuring 3.2 cm. The patient presents for planned biopsy and percutaneous cryoablation of the neoplasm.  EXAM: CT-GUIDED CORE BIOPSY OF RIGHT RENAL MASS  CT-GUIDED PERCUTANEOUS CRYOABLATION OF RIGHT RENAL MASS  ANESTHESIA/SEDATION: General  MEDICATIONS: 2 g IV Ancef. As antibiotic prophylaxis, Ancef was ordered pre-procedure and administered intravenously within one hour of incision.  CONTRAST:  None  PROCEDURE: The procedure, risks, benefits, and alternatives were explained to the patient. Questions regarding the procedure were encouraged and answered. The patient understands and consents to the procedure.  The patient was placed under general anesthesia. Initial unenhanced CT was performed in a prone position to localize the right renal mass. A time-out was performed prior to the procedure.  The right flank region was prepped with Betadine in a sterile fashion, and a sterile drape was applied covering the operative field. A sterile gown and sterile gloves were used for the procedure.  A 17 gauge trocar needle was advanced into the right renal mass. Core biopsy was performed with an 18 gauge automated core biopsy device. A total of 2 core samples were submitted in formalin for pathologic analysis.  Under CT guidance, a series of 4 Galil Ice Rod cryoablation probes were advanced into the right renal mass. Probe positioning  was confirmed by CT prior to cryoablation.  Cryoablation was performed through the 4 probes simultaneously. Initial 10 minute cycle of cryoablation was performed. This was followed by a 8 minute thaw cycle. A second 10 minute cycle of cryoablation was then performed. During ablation, periodic CT imaging was performed to monitor ice ball formation and morphology. After active thaw, the cryoablation probes were removed.  Post-procedural CT was performed.  COMPLICATIONS: None  FINDINGS: Initial unenhanced CT demonstrates an exophytic posterior interpolar renal mass measuring approximately 2.8 x 3.3 cm in greatest dimensions by axial imaging. Biopsy yielded solid tissue. The lesion is partially calcified posteriorly.  Monitoring during cryoablation demonstrates adequate ice ball formation encompassing the entire mass. There were no immediate bleeding complications. Imaging at the lung bases shows no evidence of pneumothorax following the procedure.  IMPRESSION: CT guided percutaneous core biopsy and cryoablation of a 3.2 cm right renal mass. The patient will be observed overnight.   Electronically Signed   By: Aletta Edouard M.D.   On: 05/19/2015 13:07    Labs:  CBC:  Recent Labs  05/17/15 0850  WBC 6.8  HGB 12.9*  HCT 40.9  PLT 185    COAGS:  Recent Labs  05/17/15 0850 05/19/15 0705  INR 1.78* 1.15    BMP:  Recent Labs  03/22/15 0926 05/17/15 0850  NA  --  138  K  --  5.0  CL  --  105  CO2  --  27  GLUCOSE  --  96  BUN  --  25*  CALCIUM  --  8.9  CREATININE 1.50* 1.28*  GFRNONAA  --  49*  GFRAA  --  56*    LIVER FUNCTION TESTS: No results for input(s): BILITOT, AST, ALT, ALKPHOS, PROT, ALBUMIN in the last 8760 hours.  Assessment and Plan:  S/p perc cryoablation/core bx rt renal mass 8/12; for overnight obs; check am labs; cont foley for now until hematuria resolves; f/u with Dr. Kathlene Cote in [facility] in 2-4 weeks  Signed: D. Rowe Robert 05/19/2015, 4:22 PM   I  spent a total of 15 minutes at the the patient's bedside AND on the patient's hospital floor or unit, greater than 50% of which was counseling/coordinating care for right renal mass cryoablation/bx

## 2015-05-19 NOTE — Anesthesia Procedure Notes (Signed)
Procedure Name: Intubation Date/Time: 05/19/2015 9:01 AM Performed by: Noralyn Pick D Pre-anesthesia Checklist: Patient identified, Emergency Drugs available, Suction available and Patient being monitored Patient Re-evaluated:Patient Re-evaluated prior to inductionOxygen Delivery Method: Circle System Utilized Preoxygenation: Pre-oxygenation with 100% oxygen Intubation Type: IV induction Ventilation: Mask ventilation without difficulty Laryngoscope Size: Mac and 4 Tube type: Oral Tube size: 7.5 mm Number of attempts: 1 Airway Equipment and Method: Stylet and Oral airway Placement Confirmation: ETT inserted through vocal cords under direct vision,  positive ETCO2 and breath sounds checked- equal and bilateral Secured at: 22 cm Tube secured with: Tape Dental Injury: Teeth and Oropharynx as per pre-operative assessment

## 2015-05-19 NOTE — Anesthesia Preprocedure Evaluation (Addendum)
Anesthesia Evaluation  Patient identified by MRN, date of birth, ID band Patient awake    Reviewed: Allergy & Precautions, H&P , NPO status , Patient's Chart, lab work & pertinent test results  Airway Mallampati: II  TM Distance: >3 FB Neck ROM: full    Dental  (+) Dental Advisory Given, Edentulous Upper, Edentulous Lower   Pulmonary neg pulmonary ROS, former smoker,  breath sounds clear to auscultation  Pulmonary exam normal       Cardiovascular hypertension, Pt. on medications + angina + CAD Normal cardiovascular exam+ dysrhythmias Atrial Fibrillation Rhythm:regular Rate:Normal  Sinus node dysfunction   Neuro/Psych Blind left eye negative neurological ROS  negative psych ROS   GI/Hepatic negative GI ROS, Neg liver ROS,   Endo/Other  negative endocrine ROS  Renal/GU negative Renal ROS  negative genitourinary   Musculoskeletal   Abdominal   Peds  Hematology negative hematology ROS (+)   Anesthesia Other Findings   Reproductive/Obstetrics negative OB ROS                            Anesthesia Physical Anesthesia Plan  ASA: III  Anesthesia Plan: General   Post-op Pain Management:    Induction: Intravenous  Airway Management Planned: Oral ETT  Additional Equipment:   Intra-op Plan:   Post-operative Plan: Extubation in OR  Informed Consent: I have reviewed the patients History and Physical, chart, labs and discussed the procedure including the risks, benefits and alternatives for the proposed anesthesia with the patient or authorized representative who has indicated his/her understanding and acceptance.   Dental Advisory Given  Plan Discussed with: CRNA and Surgeon  Anesthesia Plan Comments:         Anesthesia Quick Evaluation

## 2015-05-19 NOTE — Transfer of Care (Signed)
Immediate Anesthesia Transfer of Care Note  Patient: Darryl Hall  Procedure(s) Performed: Procedure(s): RIGHT RENAL CRYO ABLATION AND BIOPSY (Right)  Patient Location: PACU  Anesthesia Type:General  Level of Consciousness: awake, alert  and oriented  Airway & Oxygen Therapy: Patient Spontanous Breathing and Patient connected to face mask oxygen  Post-op Assessment: Report given to RN and Post -op Vital signs reviewed and stable  Post vital signs: Reviewed and stable  Last Vitals:  Filed Vitals:   05/19/15 0657  BP:   Pulse:   Temp: 36.7 C  Resp: 18    Complications: No apparent anesthesia complications

## 2015-05-19 NOTE — H&P (Signed)
Referring Physician(s): Nesi, M  Chief Complaint:  Enlarging right renal mass  Subjective:Pt recently seen in consultation by Dr. Kathlene Cote on  04/20/2015 for treatment considerations for enlarging solid right renal mass. The most recent MRI of the abdomen dated 03/22/15 revealed a posterior exophytic interpolar right renal mass measuring approximately 3.1 x 2.7 x 3.2 cm. There was no evidence of renal vein tumor or metastatic disease. Following the evaluation Mr. Darryl Hall was deemed an appropriate candidate for percutaneous cryoablation and biopsy. Patient's past medical history is also significant for coronary artery disease ,status post CABG 13 years ago, paroxysmal atrial fibrillation, remote colon cancer , hypertension, history of lower extremity DVT with IVC filter placement. He is on chronic warfarin therapy and was recently given cardiac clearance to proceed with the above-mentioned procedure. He presents today for CT-guided percutaneous cryoablation and biopsy of the enlarging right renal mass. Patient currently denies fevers, chills, headaches, chest pain, dyspnea, cough, back pain, nausea /vomiting or abnormal bleeding. He does complain of some mild intermittent left flank discomfort.    Allergies: Review of patient's allergies indicates no known allergies.  Medications: Prior to Admission medications   Medication Sig Start Date End Date Taking? Authorizing Provider  Calcium Carb-Cholecalciferol (CALCIUM 600 + D PO) Take 1 tablet by mouth 2 (two) times daily.   Yes Historical Provider, MD  gabapentin (NEURONTIN) 100 MG capsule Take 200 mg by mouth 2 (two) times daily.   Yes Historical Provider, MD  lisinopril (PRINIVIL,ZESTRIL) 20 MG tablet TAKE ONE TABLET BY MOUTH ONCE DAILY. Patient taking differently: TAKE 10MG  ONCE DAILY 01/02/15  Yes Mihai Croitoru, MD  lovastatin (MEVACOR) 20 MG tablet Take 40 mg by mouth 2 (two) times daily.   Yes Historical Provider, MD  Multiple  Vitamins-Minerals (PRESERVISION AREDS 2 PO) Take 1 tablet by mouth 2 (two) times daily.    Yes Historical Provider, MD  Omega-3 Fatty Acids (FISH OIL) 500 MG CAPS Take 1 capsule by mouth daily.   Yes Historical Provider, MD  traMADol (ULTRAM) 50 MG tablet Take 50 mg by mouth 2 (two) times daily.   Yes Historical Provider, MD  warfarin (COUMADIN) 3 MG tablet Take 3-4.5 mg by mouth daily. Takes 3 mg on Sunday, Monday, Wednesday, and Friday. Takes 4.5mg  on Tuesday, Thursday and Saturday   Yes Historical Provider, MD     Vital Signs: BP 141/73 mmHg  Pulse 62  Temp(Src) 98.1 F (36.7 C) (Oral)  Resp 18  Ht 5\' 8"  (1.727 m)  Wt 176 lb 1.6 oz (79.878 kg)  BMI 26.78 kg/m2  SpO2 99%  Physical Exam patient awake, alert and oriented. Chest with few bibasilar crackles. Heart- ectopy with slight bradycardia (sinus with PAC's on EKG); abdomen soft, few bowel sounds, nontender; extremities with full range of motion and only trace pretibial edema.  Imaging: No results found.  Labs:  CBC:  Recent Labs  05/17/15 0850  WBC 6.8  HGB 12.9*  HCT 40.9  PLT 185    COAGS:  Recent Labs  05/17/15 0850 05/19/15 0705  INR 1.78* 1.15    BMP:  Recent Labs  03/22/15 0926 05/17/15 0850  NA  --  138  K  --  5.0  CL  --  105  CO2  --  27  GLUCOSE  --  96  BUN  --  25*  CALCIUM  --  8.9  CREATININE 1.50* 1.28*  GFRNONAA  --  49*  GFRAA  --  56*  LIVER FUNCTION TESTS: No results for input(s): BILITOT, AST, ALT, ALKPHOS, PROT, ALBUMIN in the last 8760 hours.  Assessment and Plan: Patient with history of enlarging right renal mass. Recently seen in consultation by Dr. Kathlene Cote for treatment options. Patient presents today for CT-guided percutaneous cryoablation and biopsy of right renal mass. Details/risks of procedure, including not limited to, internal bleeding, infection, renal injury, anesthesia-related complications, discussed with patient with his understanding and consent.  Following the procedure the patient will be admitted for overnight observation.   Signed: D. Rowe Robert 05/19/2015, 8:08 AM   I spent a total of 30 minutes in face to face in clinical consultation/evaluation, greater than 50% of which was counseling/coordinating care for CT-guided percutaneous cryoablation biopsy of enlarging right renal mass

## 2015-05-20 ENCOUNTER — Encounter (HOSPITAL_COMMUNITY): Payer: Self-pay | Admitting: *Deleted

## 2015-05-20 DIAGNOSIS — C649 Malignant neoplasm of unspecified kidney, except renal pelvis: Secondary | ICD-10-CM | POA: Diagnosis not present

## 2015-05-20 LAB — BASIC METABOLIC PANEL
Anion gap: 9 (ref 5–15)
BUN: 30 mg/dL — ABNORMAL HIGH (ref 6–20)
CO2: 25 mmol/L (ref 22–32)
Calcium: 8.7 mg/dL — ABNORMAL LOW (ref 8.9–10.3)
Chloride: 103 mmol/L (ref 101–111)
Creatinine, Ser: 1.77 mg/dL — ABNORMAL HIGH (ref 0.61–1.24)
GFR calc Af Amer: 38 mL/min — ABNORMAL LOW (ref >60)
GFR calc non Af Amer: 33 mL/min — ABNORMAL LOW (ref >60)
Glucose, Bld: 139 mg/dL — ABNORMAL HIGH (ref 65–99)
Potassium: 4.6 mmol/L (ref 3.5–5.1)
Sodium: 137 mmol/L (ref 135–145)

## 2015-05-20 LAB — CBC
HCT: 38.9 % — ABNORMAL LOW (ref 39.0–52.0)
Hemoglobin: 12.4 g/dL — ABNORMAL LOW (ref 13.0–17.0)
MCH: 29.1 pg (ref 26.0–34.0)
MCHC: 31.9 g/dL (ref 30.0–36.0)
MCV: 91.3 fL (ref 78.0–100.0)
Platelets: 182 10*3/uL (ref 150–400)
RBC: 4.26 MIL/uL (ref 4.22–5.81)
RDW: 13.4 % (ref 11.5–15.5)
WBC: 12.5 10*3/uL — ABNORMAL HIGH (ref 4.0–10.5)

## 2015-05-20 MED ORDER — SODIUM CHLORIDE 0.9 % IV SOLN: INTRAVENOUS | Status: DC

## 2015-05-20 MED ORDER — SODIUM CHLORIDE 0.9 % IV SOLN
INTRAVENOUS | Status: DC
  Administered 2015-05-20: 09:00:00 via INTRAVENOUS

## 2015-05-20 NOTE — Discharge Summary (Signed)
Patient ID: Darryl Hall MRN: [id number] DOB/AGE: 06/01/1926 81 y.o.  Admit date: 05/19/2015 Discharge date: 05/20/2015  Admission Diagnoses: slow growing Rt renal mass  Discharge Diagnoses: Rt renal mas treatment  Active Problems:   Right renal mass CAD HTN A fib   Discharged Condition: improved; stable  Hospital Course: slow growing Rt renal mass noted by Dr Janice Norrie. Referred to IR for cryoablation and bx. This was performed in CT 05/19/2015 with Dr Kathlene Cote Admitted overnight for observation. Pt has done well overnight. Eating well; slept well. Ambulating. No complaints Has had small BM UOP good; slightly blood tinged. Less red than last pm. Awaiting pt to urinate on own. I have seen and examined pt and reported to Dr Kathlene Cote   Consults: None  Significant Diagnostic Studies:  CT abdomen  Treatments: CT guided Rt renal mass cryoablation  Discharge Exam: Blood pressure 124/59, pulse 71, temperature 98.3 F (36.8 C), temperature source Oral, resp. rate 16, height 5\' 8"  (1.727 m), weight 176 lb 1.6 oz (79.878 kg), SpO2 94 %.  Heart: RRR Lungs: CTA Abd: soft; +BS NT no masses Rt flank: site of procedure clean and dry NT; no bleeding; no hematoma No sign of infection A/O Appropriate UOP 1.3 liters Less bloody this am Cr 1.7 (1.28)  Results for orders placed or performed during the hospital encounter of 05/19/15  Protime-INR  Result Value Ref Range   Prothrombin Time 14.9 11.6 - 15.2 seconds   INR 1.15 0.00 - 1.49  CBC  Result Value Ref Range   WBC 12.5 (H) 4.0 - 10.5 K/uL   RBC 4.26 4.22 - 5.81 MIL/uL   Hemoglobin 12.4 (L) 13.0 - 17.0 g/dL   HCT 38.9 (L) 39.0 - 52.0 %   MCV 91.3 78.0 - 100.0 fL   MCH 29.1 26.0 - 34.0 pg   MCHC 31.9 30.0 - 36.0 g/dL   RDW 13.4 11.5 - 15.5 %   Platelets 182 150 - 400 K/uL  Basic metabolic panel  Result Value Ref Range   Sodium 137 135 - 145 mmol/L   Potassium 4.6 3.5 - 5.1 mmol/L   Chloride 103 101 - 111 mmol/L     CO2 25 22 - 32 mmol/L   Glucose, Bld 139 (H) 65 - 99 mg/dL   BUN 30 (H) 6 - 20 mg/dL   Creatinine, Ser 1.77 (H) 0.61 - 1.24 mg/dL   Calcium 8.7 (L) 8.9 - 10.3 mg/dL   GFR calc non Af Amer 33 (L) >60 mL/min   GFR calc Af Amer 38 (L) >60 mL/min   Anion gap 9 5 - 15    Disposition: Slow growing Rt renal mass CT biopsy and CT guided cryoablation performed 05/19/15 with Dr Kathlene Cote Pt has tolerated procedure well No complaints Dr Kathlene Cote aware of status of pt Pt has good understanding of discharge instructions: Resume all meds today Resume coumadin tomorrow Restful over weekend Pt to see Dr Kathlene Cote in follow up 4 weeks for visit and labs Call [phone] if needs  Discharge Instructions    Call MD for:  difficulty breathing, headache or visual disturbances    Complete by:  As directed      Call MD for:  extreme fatigue    Complete by:  As directed      Call MD for:  hives    Complete by:  As directed      Call MD for:  persistant dizziness or light-headedness    Complete by:  As directed  Call MD for:  persistant nausea and vomiting    Complete by:  As directed      Call MD for:  redness, tenderness, or signs of infection (pain, swelling, redness, odor or green/yellow discharge around incision site)    Complete by:  As directed      Call MD for:  severe uncontrolled pain    Complete by:  As directed      Call MD for:  temperature >100.4    Complete by:  As directed      Diet - low sodium heart healthy    Complete by:  As directed      Discharge instructions    Complete by:  As directed   Restful weekend; push fluids; follow with Dr Kathlene Cote 4 weeks---pt will hear from scheduler for appt time and date; call [phone] if issues/questions; resume all meds; resume coumadin Sunday 8/14     Discharge wound care:    Complete by:  As directed   May shower today; keep clean band aid on R flank daily x 1 week     Driving Restrictions    Complete by:  As directed   No  driving x 3-5 days     Increase activity slowly    Complete by:  As directed      Lifting restrictions    Complete by:  As directed   No lifting over 10 lbs x 1 week            Medication List    TAKE these medications        CALCIUM 600 + D PO  Take 1 tablet by mouth 2 (two) times daily.     Fish Oil 500 MG Caps  Take 1 capsule by mouth daily.     gabapentin 100 MG capsule  Commonly known as:  NEURONTIN  Take 200 mg by mouth 2 (two) times daily.     lisinopril 20 MG tablet  Commonly known as:  PRINIVIL,ZESTRIL  TAKE ONE TABLET BY MOUTH ONCE DAILY.     lovastatin 20 MG tablet  Commonly known as:  MEVACOR  Take 40 mg by mouth 2 (two) times daily.     PRESERVISION AREDS 2 PO  Take 1 tablet by mouth 2 (two) times daily.     traMADol 50 MG tablet  Commonly known as:  ULTRAM  Take 50 mg by mouth 2 (two) times daily.     warfarin 3 MG tablet  Commonly known as:  COUMADIN  Take 3-4.5 mg by mouth daily. Takes 3 mg on Sunday, Monday, Wednesday, and Friday. Takes 4.5mg  on Tuesday, Thursday and Saturday          Signed: Anthoney Sheppard A 05/20/2015, 10:20 AM   I have spent Greater Than 30 Minutes discharging Darryl Hall.

## 2015-05-20 NOTE — Progress Notes (Signed)
Patient ID: Darryl Hall, male   DOB: 18-Dec-1925, 81 y.o.   MRN: [id number]   Pt has urinated on own per RN 50-60 cc Minimally blood tinged Less than yesterday and less than this am  Report to Dr Kathlene Cote Call [phone] if worsens over weekend  Pt has good understanding of plan  May DC to home after lunch

## 2015-05-22 ENCOUNTER — Other Ambulatory Visit: Payer: Self-pay | Admitting: *Deleted

## 2015-05-22 ENCOUNTER — Other Ambulatory Visit (HOSPITAL_COMMUNITY): Payer: Self-pay | Admitting: Interventional Radiology

## 2015-05-22 DIAGNOSIS — Z09 Encounter for follow-up examination after completed treatment for conditions other than malignant neoplasm: Secondary | ICD-10-CM

## 2015-05-22 DIAGNOSIS — N2889 Other specified disorders of kidney and ureter: Secondary | ICD-10-CM

## 2015-05-22 DIAGNOSIS — I495 Sick sinus syndrome: Secondary | ICD-10-CM

## 2015-05-22 DIAGNOSIS — I10 Essential (primary) hypertension: Secondary | ICD-10-CM

## 2015-05-22 DIAGNOSIS — I480 Paroxysmal atrial fibrillation: Secondary | ICD-10-CM

## 2015-05-22 DIAGNOSIS — H5440 Blindness, one eye, unspecified eye: Secondary | ICD-10-CM

## 2015-05-22 DIAGNOSIS — E785 Hyperlipidemia, unspecified: Secondary | ICD-10-CM

## 2015-05-22 DIAGNOSIS — I2510 Atherosclerotic heart disease of native coronary artery without angina pectoris: Secondary | ICD-10-CM

## 2015-05-23 LAB — TYPE AND SCREEN
ABO/RH(D): O POS
Antibody Screen: NEGATIVE
Unit division: 0
Unit division: 0

## 2015-05-25 NOTE — Addendum Note (Signed)
Addended by: Diana Eves on: 05/25/2015 04:29 PM   Modules accepted: Orders

## 2015-06-22 ENCOUNTER — Ambulatory Visit
Admission: RE | Admit: 2015-06-22 | Discharge: 2015-06-22 | Disposition: A | Discharge status: Home / Self Care | Payer: Medicare Other | Source: Ambulatory Visit | Attending: Interventional Radiology | Admitting: Interventional Radiology

## 2015-06-22 DIAGNOSIS — I480 Paroxysmal atrial fibrillation: Secondary | ICD-10-CM

## 2015-06-22 DIAGNOSIS — N2889 Other specified disorders of kidney and ureter: Secondary | ICD-10-CM

## 2015-06-22 DIAGNOSIS — I10 Essential (primary) hypertension: Secondary | ICD-10-CM

## 2015-06-22 DIAGNOSIS — I2510 Atherosclerotic heart disease of native coronary artery without angina pectoris: Secondary | ICD-10-CM

## 2015-06-22 DIAGNOSIS — Z09 Encounter for follow-up examination after completed treatment for conditions other than malignant neoplasm: Secondary | ICD-10-CM

## 2015-06-22 DIAGNOSIS — C649 Malignant neoplasm of unspecified kidney, except renal pelvis: Secondary | ICD-10-CM | POA: Insufficient documentation

## 2015-06-22 DIAGNOSIS — I495 Sick sinus syndrome: Secondary | ICD-10-CM

## 2015-06-22 DIAGNOSIS — H5440 Blindness, one eye, unspecified eye: Secondary | ICD-10-CM

## 2015-06-22 DIAGNOSIS — E785 Hyperlipidemia, unspecified: Secondary | ICD-10-CM

## 2015-06-22 NOTE — Progress Notes (Signed)
Patient ID: Darryl Hall, male   DOB: 1926/05/13, 81 y.o.   MRN: [id number]   Referring Physician(s): Darryl Hall  Chief Complaint: The patient is seen in follow up today s/p CT guided core biopsy and cryoablation of a right renal mass  History of present illness: Darryl Hall is an 81 year old white male, patient of Dr. Beulah Hall, who was referred to the interventional radiology service for treatment of an enlarging solid right renal mass measuring approximately 3.2 cm. On 05/19/15 he underwent CT-guided core biopsy and percutaneous cryoablation of the right renal mass. The procedure was performed without immediate complications and he was admitted for overnight observation. He was discharged home the following day. Patient did experience a brief episode of hematuria which has since resolved. Patient's creatinine at time of discharge was 1.77 and most recently on 9/12 is 1.40. 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 Patient states that he has done very well since his discharge. He continues to drive and remains active. He has resumed his Coumadin therapy for atrial fibrillation. He denies any recent fevers, chest pain, dyspnea, abdominal/flank pain, hematuria or dysuria. He recently hit the top of his head  while working in his car port with subsequent bleeding at the site which has since resolved and did not require hospital visit.   Past Medical History  Diagnosis Date  . Hernia   . colon ca dx'd 1998 & 2002    colon resection x 2   . Gum disease     or dentures  . Colon polyp   . CAD (coronary artery disease)   . PAF (paroxysmal atrial fibrillation)     H/O  . Systemic hypertension   . Dyslipidemia   . Sinus node dysfunction   . Dysrhythmia     Paroxysmal atrial fibrillation    . History of DVT of lower extremity   . Anginal pain   . Arthritis     Past Surgical History  Procedure Laterality Date  . Cardiac catheterization   07/08/2002    three vessel  CAD  . Cardiac bypass  07/12/2002    LIMA to LAD,SVG to first diagonal,SVG to obtuse marginal one and two, SVG to conus branch & posterior descending.  . Ventral hernia repair  2012  . Hernia repair  2012  . Colon surgery      cancer  . Appendectomy      Allergies: Review of patient's allergies indicates no known allergies.  Medications: Prior to Admission medications   Medication Sig Start Date End Date Taking? Authorizing Provider  Calcium Carb-Cholecalciferol (CALCIUM 600 + D PO) Take 1 tablet by mouth 2 (two) times daily.    Historical Provider, MD  gabapentin (NEURONTIN) 100 MG capsule Take 200 mg by mouth 2 (two) times daily.    Historical Provider, MD  lisinopril (PRINIVIL,ZESTRIL) 20 MG tablet TAKE ONE TABLET BY MOUTH ONCE DAILY. Patient taking differently: TAKE 10MG  ONCE DAILY 01/02/15   Darryl Croitoru, MD  lovastatin (MEVACOR) 20 MG tablet Take 40 mg by mouth 2 (two) times daily.    Historical Provider, MD  Multiple Vitamins-Minerals (PRESERVISION AREDS 2 PO) Take 1 tablet by mouth 2 (two) times daily.     Historical Provider, MD  Omega-3 Fatty Acids (FISH OIL) 500 MG CAPS Take 1 capsule by mouth daily.    Historical Provider, MD  traMADol (ULTRAM) 50 MG tablet Take 50 mg by mouth 2 (two) times daily.    Historical  Provider, MD  warfarin (COUMADIN) 3 MG tablet Take 3-4.5 mg by mouth daily. Takes 3 mg on Sunday, Monday, Wednesday, and Friday. Takes 4.5mg  on Tuesday, Thursday and Saturday    Historical Provider, MD     Family History  Problem Relation Age of Onset  . Heart disease Father   . Cancer Brother     pt unaware of what kind    Social History   Social History  . Marital Status: Married    Spouse Name: N/A  . Number of Children: N/A  . Years of Education: N/A   Social History Main Topics  . Smoking status: Former Research scientist (life sciences)  . Smokeless tobacco: Never Used  . Alcohol Use: No  . Drug Use: No  . Sexual Activity: Not on file   Other  Topics Concern  . Not on file   Social History Narrative     Vital Signs: Blood pressure 135/64, temperature 97.6, heart rate 64, oxygen saturation 95% room air aspiration 16  Physical Exam patient is awake, alert. Small scab noted at top of patient's head without any evidence of acute bleed. Chest with few fine bibasilar crackles. Heart-occ ectopy noted, nl S1,S2; abdomen soft, positive bowel sounds, nontender . Puncture site right flank clean, dry, nontender, no ecchymosis or superficial hematoma. Extremities with full range of motion and no edema.  Imaging: No results found.  Labs:  CBC:  Recent Labs  05/17/15 0850 05/20/15 0505  WBC 6.8 12.5*  HGB 12.9* 12.4*  HCT 40.9 38.9*  PLT 185 182    COAGS:  Recent Labs  05/17/15 0850 05/19/15 0705  INR 1.78* 1.15    BMP:  Recent Labs  03/22/15 0926 05/17/15 0850 05/20/15 0505  NA  --  138 137  K  --  5.0 4.6  CL  --  105 103  CO2  --  27 25  GLUCOSE  --  96 139*  BUN  --  25* 30*  CALCIUM  --  8.9 8.7*  CREATININE 1.50* 1.28* 1.77*  GFRNONAA  --  49* 33*  GFRAA  --  56* 38*    LIVER FUNCTION TESTS: No results for input(s): BILITOT, AST, ALT, ALKPHOS, PROT, ALBUMIN in the last 8760 hours.  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 Patient is currently stable with no  significant complaints at this time, specifically no chest pain, abdominal/back/flank pain, hematuria or dysuria. Latest creatinine from 9/12 is 1.40. Plan at this time is for follow-up MRI of the abdomen with half dose gadolinium in November of this year. If follow-up study is negative the patient will undergo repeat imaging 1 year post procedure.   Signed: D. Rowe Hall 06/22/2015, 8:11 AM   Please refer to Dr. Kathlene Cote attestation of this note for management and plan.

## 2015-08-03 ENCOUNTER — Other Ambulatory Visit (HOSPITAL_COMMUNITY): Payer: Self-pay | Admitting: Interventional Radiology

## 2015-08-03 DIAGNOSIS — C641 Malignant neoplasm of right kidney, except renal pelvis: Secondary | ICD-10-CM

## 2015-08-17 ENCOUNTER — Other Ambulatory Visit (HOSPITAL_COMMUNITY): Payer: Self-pay | Admitting: Interventional Radiology

## 2015-08-23 ENCOUNTER — Ambulatory Visit
Admission: RE | Admit: 2015-08-23 | Discharge: 2015-08-23 | Disposition: A | Discharge status: Home / Self Care | Payer: Medicare Other | Source: Ambulatory Visit | Attending: Interventional Radiology | Admitting: Interventional Radiology

## 2015-08-23 ENCOUNTER — Ambulatory Visit (HOSPITAL_COMMUNITY)
Admission: RE | Admit: 2015-08-23 | Discharge: 2015-08-23 | Disposition: A | Discharge status: Home / Self Care | Payer: Medicare Other | Source: Ambulatory Visit | Attending: Interventional Radiology | Admitting: Interventional Radiology

## 2015-08-23 DIAGNOSIS — N289 Disorder of kidney and ureter, unspecified: Secondary | ICD-10-CM | POA: Insufficient documentation

## 2015-08-23 DIAGNOSIS — C641 Malignant neoplasm of right kidney, except renal pelvis: Secondary | ICD-10-CM

## 2015-08-23 DIAGNOSIS — C649 Malignant neoplasm of unspecified kidney, except renal pelvis: Secondary | ICD-10-CM | POA: Insufficient documentation

## 2015-08-23 DIAGNOSIS — Z9889 Other specified postprocedural states: Secondary | ICD-10-CM | POA: Diagnosis not present

## 2015-08-23 DIAGNOSIS — K7689 Other specified diseases of liver: Secondary | ICD-10-CM | POA: Insufficient documentation

## 2015-08-23 MED ORDER — GADOBENATE DIMEGLUMINE 529 MG/ML IV SOLN
10.0000 mL | Freq: Once | INTRAVENOUS | Status: AC | PRN
  Administered 2015-08-23: 7 mL via INTRAVENOUS

## 2015-08-23 NOTE — H&P (Signed)
Referring Physician(s): Dr. Janice Norrie  Chief Complaint: The patient is seen in follow up today s/p CT guided core biopsy and cryoablation of a right renal mass on 05/19/2015  History of present illness: Mr. Darryl Hall is an 81 year old white male, patient of Dr. Beulah Gandy, who was referred to the interventional radiology service for treatment of an enlarging solid right renal mass measuring approximately 3.2 cm. On 05/19/15 he underwent CT-guided core biopsy and percutaneous cryoablation of the right renal mass. Today he denies any hematuria, fever, chills, right flank pain or abdominal pain. He does admit to some intermittent left flank pain and some increased urinary frequency without hesitation, urgency or dysuria. He denies any chest pain or shortness of breath. He denies any tobacco use and quit over 30 years ago. He has underwent follow up MRI imaging today.   Past Medical History  Diagnosis Date  . Hernia   . colon ca dx'd 1998 & 2002    colon resection x 2   . Gum disease     or dentures  . Colon polyp   . CAD (coronary artery disease)   . PAF (paroxysmal atrial fibrillation)     H/O  . Systemic hypertension   . Dyslipidemia   . Sinus node dysfunction   . Dysrhythmia     Paroxysmal atrial fibrillation    . History of DVT of lower extremity   . Anginal pain   . Arthritis     Past Surgical History  Procedure Laterality Date  . Cardiac catheterization  07/08/2002    three vessel  CAD  . Cardiac bypass  07/12/2002    LIMA to LAD,SVG to first diagonal,SVG to obtuse marginal one and two, SVG to conus branch & posterior descending.  . Ventral hernia repair  2012  . Hernia repair  2012  . Colon surgery      cancer  . Appendectomy      Allergies: Review of patient's allergies indicates no known allergies.  Medications: Prior to Admission medications   Medication Sig Start Date End Date Taking? Authorizing Provider  Calcium Carb-Cholecalciferol (CALCIUM 600 + D PO) Take 1  tablet by mouth 2 (two) times daily.   Yes Historical Provider, MD  gabapentin (NEURONTIN) 100 MG capsule Take 200 mg by mouth 2 (two) times daily.   Yes Historical Provider, MD  lisinopril (PRINIVIL,ZESTRIL) 20 MG tablet TAKE ONE TABLET BY MOUTH ONCE DAILY. Patient taking differently: TAKE 10MG  ONCE DAILY 01/02/15  Yes Mihai Croitoru, MD  lovastatin (MEVACOR) 20 MG tablet Take 40 mg by mouth 2 (two) times daily.   Yes Historical Provider, MD  Multiple Vitamins-Minerals (PRESERVISION AREDS 2 PO) Take 1 tablet by mouth 2 (two) times daily.    Yes Historical Provider, MD  Omega-3 Fatty Acids (FISH OIL) 500 MG CAPS Take 1 capsule by mouth daily.   Yes Historical Provider, MD  traMADol (ULTRAM) 50 MG tablet Take 50 mg by mouth 2 (two) times daily.   Yes Historical Provider, MD  warfarin (COUMADIN) 3 MG tablet Take 3-4.5 mg by mouth daily. Takes 3 mg on Sunday, Monday, Wednesday, and Friday. Takes 4.5mg  on Tuesday, Thursday and Saturday   Yes Historical Provider, MD     Family History  Problem Relation Age of Onset  . Heart disease Father   . Cancer Brother     pt unaware of what kind    Social History   Social History  . Marital Status: Married    Spouse Name: N/A  .  Number of Children: N/A  . Years of Education: N/A   Social History Main Topics  . Smoking status: Former Research scientist (life sciences)  . Smokeless tobacco: Never Used  . Alcohol Use: No  . Drug Use: No  . Sexual Activity: Not on file   Other Topics Concern  . Not on file   Social History Narrative   Vital Signs: BP 111/69 mmHg  Pulse 59  Temp(Src) 97.5 F (36.4 C) (Oral)  Resp 14  SpO2 97%  Physical Exam  Constitutional: He is oriented to person, place, and time. No distress.  Cardiovascular: Exam reveals no gallop and no friction rub.   No murmur heard. Occasional ectopy noted  Pulmonary/Chest: Effort normal and breath sounds normal. No respiratory distress. He has no wheezes. He has no rales.  Abdominal: Soft. Bowel sounds  are normal. He exhibits no distension. There is no tenderness.  Neurological: He is alert and oriented to person, place, and time.  Skin: Skin is warm and dry. He is not diaphoretic.  Psychiatric: He has a normal mood and affect. His behavior is normal. Thought content normal.    Imaging: Mr Abdomen W Wo Contrast  08/23/2015  CLINICAL DATA:  3 month follow-up cryoablation of papillary right renal cell carcinoma EXAM: MRI ABDOMEN WITHOUT AND WITH CONTRAST TECHNIQUE: Multiplanar multisequence MR imaging of the abdomen was performed both before and after the administration of intravenous contrast. CONTRAST:  55mL MULTIHANCE GADOBENATE DIMEGLUMINE 529 MG/ML IV SOLN COMPARISON:  MRI abdomen dated 03/22/2015 FINDINGS: Motion degraded images. Lower chest:  Lung bases are clear. Hepatobiliary: Scattered tiny hepatic cysts measuring up to 8 mm in the posterior right hepatic dome (series 7/image 18). No hepatic steatosis. No suspicious/enhancing hepatic lesions. Gallbladder is unremarkable. No intrahepatic or extrahepatic ductal dilatation. Pancreas: Within normal limits. Spleen: Within normal limits. Adrenals/Urinary Tract: Adrenal glands are within normal limits. 3.9 x 4.0 cm renal cryoablation zone in the posterior right upper kidney (series 7/ image 31). No residual enhancement to suggest viable tumor. 12 mm T2 hypointense lesion in the posterior right lower kidney (series 7/ image 39) with suspected subtle enhancement (series 1003/image 59), measuring 11 mm in 2013 and 7 mm in 2009, without definite hyperintensity on precontrast T1 images. A small indolent renal neoplasm is possible. Additional bilateral renal cysts measuring up to 13 mm in the left lower pole. No hydronephrosis. Stomach/Bowel: Stomach is notable for a tiny hiatal hernia. Visualized bowel is unremarkable. Vascular/Lymphatic: No evidence abdominal aortic aneurysm. IVC filter. No suspicious abdominal lymphadenopathy. Other: No abdominal ascites.  Musculoskeletal: No focal osseous lesions. IMPRESSION: 3.9 x 4.0 cm renal cryoablation zone in the posterior right upper kidney. No residual enhancement to suggest viable tumor. Additional 12 mm lesion in the posterior right lower kidney with suspected subtle enhancement. A small indolent renal neoplasm is possible. Electronically Signed   By: Julian Hy M.D.   On: 08/23/2015 09:18    Labs:  CBC:  Recent Labs  05/17/15 0850 05/20/15 0505  WBC 6.8 12.5*  HGB 12.9* 12.4*  HCT 40.9 38.9*  PLT 185 182    COAGS:  Recent Labs  05/17/15 0850 05/19/15 0705  INR 1.78* 1.15    BMP:  Recent Labs  03/22/15 0926 05/17/15 0850 05/20/15 0505  NA  --  138 137  K  --  5.0 4.6  CL  --  105 103  CO2  --  27 25  GLUCOSE  --  96 139*  BUN  --  25* 30*  CALCIUM  --  8.9 8.7*  CREATININE 1.50* 1.28* 1.77*  GFRNONAA  --  49* 33*  GFRAA  --  56* 38*    Assessment: Patient with history of an enlarging right renal mass of approximately 3.2 cm, status post core biopsy and cryoablation on 05/19/15; pathology revealed 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 Patient is currently stable with nosignificant complaints at this time.  MRI today reveals no residual disease at site of treated ablation. Right renal posterior lower pole with 12 mm subtle enhancement, previously measured 7 mm 2009. Discussed with Dr. Kathlene Cote today and will continue to follow with imaging. Patient will return to IR clinic 05/2016 with repeat MRI at that time.    SignedHedy Jacob 08/23/2015, 10:12 AM   Please refer to Dr. Kathlene Cote attestation of this note for management and plan.

## 2015-11-01 ENCOUNTER — Other Ambulatory Visit: Payer: Self-pay | Admitting: Cardiovascular Disease

## 2015-11-01 NOTE — Telephone Encounter (Signed)
Rx request sent to pharmacy.  

## 2015-11-20 DIAGNOSIS — C44519 Basal cell carcinoma of skin of other part of trunk: Secondary | ICD-10-CM | POA: Diagnosis not present

## 2015-11-20 DIAGNOSIS — Z08 Encounter for follow-up examination after completed treatment for malignant neoplasm: Secondary | ICD-10-CM | POA: Diagnosis not present

## 2015-11-20 DIAGNOSIS — Z85828 Personal history of other malignant neoplasm of skin: Secondary | ICD-10-CM | POA: Diagnosis not present

## 2015-11-20 DIAGNOSIS — L570 Actinic keratosis: Secondary | ICD-10-CM | POA: Diagnosis not present

## 2015-11-20 DIAGNOSIS — X32XXXD Exposure to sunlight, subsequent encounter: Secondary | ICD-10-CM | POA: Diagnosis not present

## 2015-11-27 DIAGNOSIS — Z7901 Long term (current) use of anticoagulants: Secondary | ICD-10-CM | POA: Diagnosis not present

## 2015-11-27 DIAGNOSIS — I2782 Chronic pulmonary embolism: Secondary | ICD-10-CM | POA: Diagnosis not present

## 2015-12-13 DIAGNOSIS — H353211 Exudative age-related macular degeneration, right eye, with active choroidal neovascularization: Secondary | ICD-10-CM | POA: Diagnosis not present

## 2016-01-15 DIAGNOSIS — I4891 Unspecified atrial fibrillation: Secondary | ICD-10-CM | POA: Diagnosis not present

## 2016-01-15 DIAGNOSIS — Z7901 Long term (current) use of anticoagulants: Secondary | ICD-10-CM | POA: Diagnosis not present

## 2016-01-17 DIAGNOSIS — H353222 Exudative age-related macular degeneration, left eye, with inactive choroidal neovascularization: Secondary | ICD-10-CM | POA: Diagnosis not present

## 2016-01-17 DIAGNOSIS — H353211 Exudative age-related macular degeneration, right eye, with active choroidal neovascularization: Secondary | ICD-10-CM | POA: Diagnosis not present

## 2016-02-05 ENCOUNTER — Other Ambulatory Visit: Payer: Self-pay | Admitting: Cardiovascular Disease

## 2016-02-06 NOTE — Telephone Encounter (Signed)
REFILL 

## 2016-02-23 DIAGNOSIS — H353211 Exudative age-related macular degeneration, right eye, with active choroidal neovascularization: Secondary | ICD-10-CM | POA: Diagnosis not present

## 2016-02-26 DIAGNOSIS — Z7901 Long term (current) use of anticoagulants: Secondary | ICD-10-CM | POA: Diagnosis not present

## 2016-02-26 DIAGNOSIS — I4891 Unspecified atrial fibrillation: Secondary | ICD-10-CM | POA: Diagnosis not present

## 2016-03-27 DIAGNOSIS — H353211 Exudative age-related macular degeneration, right eye, with active choroidal neovascularization: Secondary | ICD-10-CM | POA: Diagnosis not present

## 2016-03-29 DIAGNOSIS — Z0000 Encounter for general adult medical examination without abnormal findings: Secondary | ICD-10-CM | POA: Diagnosis not present

## 2016-03-29 DIAGNOSIS — I2510 Atherosclerotic heart disease of native coronary artery without angina pectoris: Secondary | ICD-10-CM | POA: Diagnosis not present

## 2016-03-29 DIAGNOSIS — E782 Mixed hyperlipidemia: Secondary | ICD-10-CM | POA: Diagnosis not present

## 2016-03-29 DIAGNOSIS — I4891 Unspecified atrial fibrillation: Secondary | ICD-10-CM | POA: Diagnosis not present

## 2016-03-29 DIAGNOSIS — I10 Essential (primary) hypertension: Secondary | ICD-10-CM | POA: Diagnosis not present

## 2016-04-01 DIAGNOSIS — Z7901 Long term (current) use of anticoagulants: Secondary | ICD-10-CM | POA: Diagnosis not present

## 2016-04-01 DIAGNOSIS — I4891 Unspecified atrial fibrillation: Secondary | ICD-10-CM | POA: Diagnosis not present

## 2016-04-15 DIAGNOSIS — I4891 Unspecified atrial fibrillation: Secondary | ICD-10-CM | POA: Diagnosis not present

## 2016-04-15 DIAGNOSIS — N183 Chronic kidney disease, stage 3 (moderate): Secondary | ICD-10-CM | POA: Diagnosis not present

## 2016-04-15 DIAGNOSIS — E782 Mixed hyperlipidemia: Secondary | ICD-10-CM | POA: Diagnosis not present

## 2016-04-15 DIAGNOSIS — I2510 Atherosclerotic heart disease of native coronary artery without angina pectoris: Secondary | ICD-10-CM | POA: Diagnosis not present

## 2016-05-01 DIAGNOSIS — H353211 Exudative age-related macular degeneration, right eye, with active choroidal neovascularization: Secondary | ICD-10-CM | POA: Diagnosis not present

## 2016-05-05 ENCOUNTER — Other Ambulatory Visit: Payer: Self-pay | Admitting: Cardiovascular Disease

## 2016-05-17 DIAGNOSIS — Z85828 Personal history of other malignant neoplasm of skin: Secondary | ICD-10-CM | POA: Diagnosis not present

## 2016-05-17 DIAGNOSIS — X32XXXD Exposure to sunlight, subsequent encounter: Secondary | ICD-10-CM | POA: Diagnosis not present

## 2016-05-17 DIAGNOSIS — L570 Actinic keratosis: Secondary | ICD-10-CM | POA: Diagnosis not present

## 2016-05-17 DIAGNOSIS — Z08 Encounter for follow-up examination after completed treatment for malignant neoplasm: Secondary | ICD-10-CM | POA: Diagnosis not present

## 2016-05-20 DIAGNOSIS — I4891 Unspecified atrial fibrillation: Secondary | ICD-10-CM | POA: Diagnosis not present

## 2016-05-20 DIAGNOSIS — Z7901 Long term (current) use of anticoagulants: Secondary | ICD-10-CM | POA: Diagnosis not present

## 2016-05-28 ENCOUNTER — Other Ambulatory Visit: Payer: Self-pay | Admitting: Radiology

## 2016-05-28 ENCOUNTER — Other Ambulatory Visit (HOSPITAL_COMMUNITY): Payer: Self-pay | Admitting: Interventional Radiology

## 2016-05-28 DIAGNOSIS — C641 Malignant neoplasm of right kidney, except renal pelvis: Secondary | ICD-10-CM

## 2016-05-29 DIAGNOSIS — I10 Essential (primary) hypertension: Secondary | ICD-10-CM | POA: Diagnosis not present

## 2016-06-02 ENCOUNTER — Other Ambulatory Visit: Payer: Self-pay | Admitting: Cardiovascular Disease

## 2016-06-03 NOTE — Telephone Encounter (Signed)
Rx(s) sent to pharmacy electronically.  

## 2016-06-06 ENCOUNTER — Ambulatory Visit
Admission: RE | Admit: 2016-06-06 | Discharge: 2016-06-06 | Disposition: A | Discharge status: Home / Self Care | Payer: Medicare Other | Source: Ambulatory Visit | Attending: Interventional Radiology | Admitting: Interventional Radiology

## 2016-06-06 ENCOUNTER — Ambulatory Visit (HOSPITAL_COMMUNITY)
Admission: RE | Admit: 2016-06-06 | Discharge: 2016-06-06 | Disposition: A | Discharge status: Home / Self Care | Payer: Medicare Other | Source: Ambulatory Visit | Attending: Interventional Radiology | Admitting: Interventional Radiology

## 2016-06-06 DIAGNOSIS — N289 Disorder of kidney and ureter, unspecified: Secondary | ICD-10-CM | POA: Diagnosis not present

## 2016-06-06 DIAGNOSIS — C641 Malignant neoplasm of right kidney, except renal pelvis: Secondary | ICD-10-CM | POA: Diagnosis not present

## 2016-06-06 DIAGNOSIS — Z9889 Other specified postprocedural states: Secondary | ICD-10-CM | POA: Diagnosis not present

## 2016-06-06 DIAGNOSIS — K8020 Calculus of gallbladder without cholecystitis without obstruction: Secondary | ICD-10-CM | POA: Diagnosis not present

## 2016-06-06 HISTORY — PX: IR GENERIC HISTORICAL: IMG1180011

## 2016-06-06 MED ORDER — GADOBENATE DIMEGLUMINE 529 MG/ML IV SOLN
10.0000 mL | Freq: Once | INTRAVENOUS | Status: AC | PRN
  Administered 2016-06-06: 7 mL via INTRAVENOUS

## 2016-06-06 NOTE — Progress Notes (Signed)
  Chief Complaint: Status post percutaneous cryoablation of a biopsy-proven right upper pole papillary renal carcinoma on 05/19/2015.  History of Present Illness: Darryl Hall is a 82 y.o. male status post cryoablation of a right renal papillary carcinoma one year ago. He has done well after ablation. He is still exercising regularly. He does complain of periodic left-sided flank and back pain which improves with rest. He has no right-sided pain. He denies any urinary symptoms.  Past Medical History:  Diagnosis Date  . Anginal pain   . Arthritis   . CAD (coronary artery disease)   . colon ca dx'd 1998 & 2002   colon resection x 2   . Colon polyp   . Dyslipidemia   . Dysrhythmia    Paroxysmal atrial fibrillation    . Gum disease    or dentures  . Hernia   . History of DVT of lower extremity   . PAF (paroxysmal atrial fibrillation)    H/O  . Sinus node dysfunction   . Systemic hypertension     Past Surgical History:  Procedure Laterality Date  . APPENDECTOMY    . cardiac bypass  07/12/2002   LIMA to LAD,SVG to first diagonal,SVG to obtuse marginal one and two, SVG to conus branch & posterior descending.  . CARDIAC CATHETERIZATION  07/08/2002   three vessel  CAD  . COLON SURGERY     cancer  . HERNIA REPAIR  2012  . VENTRAL HERNIA REPAIR  2012    Allergies: Review of patient's allergies indicates no known allergies.  Medications: Prior to Admission medications   Medication Sig Start Date End Date Taking? Authorizing Provider  Calcium Carb-Cholecalciferol (CALCIUM 600 + D PO) Take 1 tablet by mouth 2 (two) times daily.   Yes Historical Provider, MD  gabapentin (NEURONTIN) 100 MG capsule Take 200 mg by mouth 2 (two) times daily.   Yes Historical Provider, MD  lisinopril (PRINIVIL,ZESTRIL) 20 MG tablet Take 1 tablet (20 mg total) by mouth daily. PLEASE CONTACT OFFICE FOR ADDITIONAL REFILLS 2ND ATTEMPT 06/03/16  Yes Mihai Croitoru, MD  lovastatin (MEVACOR) 20 MG tablet  Take 40 mg by mouth 2 (two) times daily.   Yes Historical Provider, MD  Multiple Vitamins-Minerals (PRESERVISION AREDS 2 PO) Take 1 tablet by mouth 2 (two) times daily.    Yes Historical Provider, MD  Omega-3 Fatty Acids (FISH OIL) 500 MG CAPS Take 1 capsule by mouth daily.   Yes Historical Provider, MD  traMADol (ULTRAM) 50 MG tablet Take 50 mg by mouth 2 (two) times daily.   Yes Historical Provider, MD  warfarin (COUMADIN) 3 MG tablet Take 3-4.5 mg by mouth daily. Takes 3 mg on Sunday, Monday, Wednesday, and Friday. Takes 4.5mg on Tuesday, Thursday and Saturday   Yes Historical Provider, MD     Family History  Problem Relation Age of Onset  . Heart disease Father   . Cancer Brother     pt unaware of what kind    Social History   Social History  . Marital status: Single    Spouse name: N/A  . Number of children: N/A  . Years of education: N/A   Social History Main Topics  . Smoking status: Former Smoker  . Smokeless tobacco: Never Used  . Alcohol use No  . Drug use: No  . Sexual activity: Not on file   Other Topics Concern  . Not on file   Social History Narrative  . No narrative on file      Review of Systems: A 12 point ROS discussed and pertinent positives are indicated in the HPI above.  All other systems are negative.  Review of Systems  Constitutional: Negative.   Respiratory: Negative.   Cardiovascular: Negative.   Gastrointestinal: Negative.   Genitourinary: Negative.   Musculoskeletal: Negative for gait problem.       Periodic left flank and back pain.  Neurological: Negative.     Vital Signs: BP (!) 107/58 (BP Location: Left Arm, Patient Position: Sitting, Cuff Size: Normal)   Pulse 65   Temp 97.8 F (36.6 C) (Oral)   Resp 15   Ht 5' 7" (1.702 m)   Wt 165 lb (74.8 kg)   SpO2 99%   BMI 25.84 kg/m   Physical Exam  Constitutional: He is oriented to person, place, and time. He appears well-developed and well-nourished. No distress.  Abdominal: Soft.  He exhibits no distension and no mass. There is no tenderness. There is no rebound and no guarding.  Musculoskeletal: He exhibits no edema.  Neurological: He is alert and oriented to person, place, and time.  Skin: He is not diaphoretic.  Nursing note and vitals reviewed.   Imaging: Mr Abdomen W Wo Contrast  Result Date: 06/06/2016 CLINICAL DATA:  Followup right papillary renal cell carcinoma status post percutaneous cryoablation. EXAM: MRI ABDOMEN WITHOUT AND WITH CONTRAST TECHNIQUE: Multiplanar multisequence MR imaging of the abdomen was performed both before and after the administration of intravenous contrast. CONTRAST:  7mL MULTIHANCE GADOBENATE DIMEGLUMINE 529 MG/ML IV SOLN COMPARISON:  08/23/2015 FINDINGS: Lower chest:  No acute findings. Hepatobiliary: Some motion artifact noted. Tiny sub-cm hepatic cysts show no significant change. No liver masses are identified. Tiny gallstones are noted however there is no evidence of cholecystitis or biliary ductal dilatation. Pancreas: No mass, inflammatory changes, or other parenchymal abnormality identified. Spleen:  Within normal limits in size and appearance. Adrenals/Urinary Tract: Normal adrenal glands. Cryoablation zone along the posterior interpolar region of the right kidney has a more well-defined appearance on today's study. No evidence of associated enhancing mass at this site to suggest residual or recurrent carcinoma. There is a stable subcapsular lesion in the posterior lower pole of the right kidney which measures 12 mm on image 42/6. This shows T1 and T2 hypointensity and evidence of contrast enhancement on subtraction imaging. Although stable, this is suspicious for additional small solid renal neoplasm such as papillary renal cell carcinoma. Other small bilateral renal cysts are noted bilaterally which are stable. No evidence of hydronephrosis. Stomach/Bowel: Visualized portions within the abdomen are unremarkable. Vascular/Lymphatic: No  pathologically enlarged lymph nodes identified. No abdominal aortic aneurysm demonstrated. Other:  None. Musculoskeletal:  No suspicious bone lesions identified. IMPRESSION: Expected evolution of right renal cryoablation zone, with no evidence of residual or recurrent tumor at this site. Stable 12 mm subcapsular lesion in posterior lower pole of right kidney with evidence of contrast enhancement. Although stable, this is suspicious for an additional small solid renal neoplasm such as a papillary renal cell carcinoma. No evidence of abdominal metastatic disease. Cholelithiasis, without evidence of cholecystitis or biliary ductal dilatation. Electronically Signed   By: John  Stahl M.D.   On: 06/06/2016 08:31    Labs: BUN 33, creatinine 1.5 on 05/29/2016   Assessment and Plan:  I met with Darryl Hall. We reviewed MRI of the abdomen with and without gadolinium performed earlier today on 06/06/2016. This demonstrates some contraction of post ablation changes at the level of the treated right upper pole papillary carcinoma. There   is no evidence of abnormal enhancement to suggest residual or recurrent tumor. The small exophytic 12 mm lesion off of the posterior cortex of the lower pole on the right appears stable and demonstrates mild enhancement. No other renal lesions identified.  I recommended continued surveillance of the ablation site and the small lower pole right renal lesion. Although this may be an early papillary carcinoma, it shows no growth over the last 9 months. Given some degree of chronic kidney disease as well as the patient's age, I recommended that we only consider treatment of this mass should it clearly enlarge over time.  Electronically SignedAletta Edouard T 06/06/2016, 5:37 PM     I spent a total of 15 Minutes in face to face in clinical consultation, greater than 50% of which was counseling/coordinating care post ablation of a right renal papillary carcinoma.

## 2016-06-12 DIAGNOSIS — H353211 Exudative age-related macular degeneration, right eye, with active choroidal neovascularization: Secondary | ICD-10-CM | POA: Diagnosis not present

## 2016-07-08 DIAGNOSIS — Z23 Encounter for immunization: Secondary | ICD-10-CM | POA: Diagnosis not present

## 2016-07-08 DIAGNOSIS — Z7901 Long term (current) use of anticoagulants: Secondary | ICD-10-CM | POA: Diagnosis not present

## 2016-07-08 DIAGNOSIS — I2782 Chronic pulmonary embolism: Secondary | ICD-10-CM | POA: Diagnosis not present

## 2016-07-16 DIAGNOSIS — H401111 Primary open-angle glaucoma, right eye, mild stage: Secondary | ICD-10-CM | POA: Diagnosis not present

## 2016-07-16 DIAGNOSIS — H353222 Exudative age-related macular degeneration, left eye, with inactive choroidal neovascularization: Secondary | ICD-10-CM | POA: Diagnosis not present

## 2016-07-16 DIAGNOSIS — H353211 Exudative age-related macular degeneration, right eye, with active choroidal neovascularization: Secondary | ICD-10-CM | POA: Diagnosis not present

## 2016-08-19 DIAGNOSIS — H353222 Exudative age-related macular degeneration, left eye, with inactive choroidal neovascularization: Secondary | ICD-10-CM | POA: Diagnosis not present

## 2016-08-19 DIAGNOSIS — H353134 Nonexudative age-related macular degeneration, bilateral, advanced atrophic with subfoveal involvement: Secondary | ICD-10-CM | POA: Diagnosis not present

## 2016-08-19 DIAGNOSIS — H353211 Exudative age-related macular degeneration, right eye, with active choroidal neovascularization: Secondary | ICD-10-CM | POA: Diagnosis not present

## 2016-08-26 DIAGNOSIS — I4891 Unspecified atrial fibrillation: Secondary | ICD-10-CM | POA: Diagnosis not present

## 2016-08-26 DIAGNOSIS — Z7901 Long term (current) use of anticoagulants: Secondary | ICD-10-CM | POA: Diagnosis not present

## 2016-09-23 DIAGNOSIS — H353222 Exudative age-related macular degeneration, left eye, with inactive choroidal neovascularization: Secondary | ICD-10-CM | POA: Diagnosis not present

## 2016-09-23 DIAGNOSIS — H353211 Exudative age-related macular degeneration, right eye, with active choroidal neovascularization: Secondary | ICD-10-CM | POA: Diagnosis not present

## 2016-09-23 DIAGNOSIS — H353134 Nonexudative age-related macular degeneration, bilateral, advanced atrophic with subfoveal involvement: Secondary | ICD-10-CM | POA: Diagnosis not present

## 2016-10-10 ENCOUNTER — Encounter: Payer: Self-pay | Admitting: Interventional Radiology

## 2016-10-14 DIAGNOSIS — I4891 Unspecified atrial fibrillation: Secondary | ICD-10-CM | POA: Diagnosis not present

## 2016-10-14 DIAGNOSIS — Z7901 Long term (current) use of anticoagulants: Secondary | ICD-10-CM | POA: Diagnosis not present

## 2016-10-30 DIAGNOSIS — H353134 Nonexudative age-related macular degeneration, bilateral, advanced atrophic with subfoveal involvement: Secondary | ICD-10-CM | POA: Diagnosis not present

## 2016-10-30 DIAGNOSIS — H353211 Exudative age-related macular degeneration, right eye, with active choroidal neovascularization: Secondary | ICD-10-CM | POA: Diagnosis not present

## 2016-10-30 DIAGNOSIS — H4051X3 Glaucoma secondary to other eye disorders, right eye, severe stage: Secondary | ICD-10-CM | POA: Diagnosis not present

## 2016-10-30 DIAGNOSIS — H401111 Primary open-angle glaucoma, right eye, mild stage: Secondary | ICD-10-CM | POA: Diagnosis not present

## 2016-10-30 DIAGNOSIS — H353222 Exudative age-related macular degeneration, left eye, with inactive choroidal neovascularization: Secondary | ICD-10-CM | POA: Diagnosis not present

## 2016-11-06 DIAGNOSIS — H4051X3 Glaucoma secondary to other eye disorders, right eye, severe stage: Secondary | ICD-10-CM | POA: Diagnosis not present

## 2016-11-06 DIAGNOSIS — H401111 Primary open-angle glaucoma, right eye, mild stage: Secondary | ICD-10-CM | POA: Diagnosis not present

## 2016-12-02 DIAGNOSIS — I2510 Atherosclerotic heart disease of native coronary artery without angina pectoris: Secondary | ICD-10-CM | POA: Diagnosis not present

## 2016-12-02 DIAGNOSIS — E782 Mixed hyperlipidemia: Secondary | ICD-10-CM | POA: Diagnosis not present

## 2016-12-04 DIAGNOSIS — H353211 Exudative age-related macular degeneration, right eye, with active choroidal neovascularization: Secondary | ICD-10-CM | POA: Diagnosis not present

## 2016-12-09 DIAGNOSIS — Z7901 Long term (current) use of anticoagulants: Secondary | ICD-10-CM | POA: Diagnosis not present

## 2016-12-09 DIAGNOSIS — I4891 Unspecified atrial fibrillation: Secondary | ICD-10-CM | POA: Diagnosis not present

## 2016-12-11 DIAGNOSIS — I2510 Atherosclerotic heart disease of native coronary artery without angina pectoris: Secondary | ICD-10-CM | POA: Diagnosis not present

## 2016-12-11 DIAGNOSIS — N183 Chronic kidney disease, stage 3 (moderate): Secondary | ICD-10-CM | POA: Diagnosis not present

## 2016-12-11 DIAGNOSIS — I4891 Unspecified atrial fibrillation: Secondary | ICD-10-CM | POA: Diagnosis not present

## 2016-12-11 DIAGNOSIS — E782 Mixed hyperlipidemia: Secondary | ICD-10-CM | POA: Diagnosis not present

## 2017-01-06 DIAGNOSIS — H353211 Exudative age-related macular degeneration, right eye, with active choroidal neovascularization: Secondary | ICD-10-CM | POA: Diagnosis not present

## 2017-01-27 DIAGNOSIS — Z7901 Long term (current) use of anticoagulants: Secondary | ICD-10-CM | POA: Diagnosis not present

## 2017-01-27 DIAGNOSIS — I4891 Unspecified atrial fibrillation: Secondary | ICD-10-CM | POA: Diagnosis not present

## 2017-02-10 DIAGNOSIS — H353211 Exudative age-related macular degeneration, right eye, with active choroidal neovascularization: Secondary | ICD-10-CM | POA: Diagnosis not present

## 2017-02-10 DIAGNOSIS — H353222 Exudative age-related macular degeneration, left eye, with inactive choroidal neovascularization: Secondary | ICD-10-CM | POA: Diagnosis not present

## 2017-03-17 DIAGNOSIS — Z7901 Long term (current) use of anticoagulants: Secondary | ICD-10-CM | POA: Diagnosis not present

## 2017-03-17 DIAGNOSIS — I4891 Unspecified atrial fibrillation: Secondary | ICD-10-CM | POA: Diagnosis not present

## 2017-03-26 DIAGNOSIS — H35721 Serous detachment of retinal pigment epithelium, right eye: Secondary | ICD-10-CM | POA: Diagnosis not present

## 2017-03-26 DIAGNOSIS — H401111 Primary open-angle glaucoma, right eye, mild stage: Secondary | ICD-10-CM | POA: Diagnosis not present

## 2017-03-26 DIAGNOSIS — H353211 Exudative age-related macular degeneration, right eye, with active choroidal neovascularization: Secondary | ICD-10-CM | POA: Diagnosis not present

## 2017-03-26 DIAGNOSIS — H4051X3 Glaucoma secondary to other eye disorders, right eye, severe stage: Secondary | ICD-10-CM | POA: Diagnosis not present

## 2017-04-01 DIAGNOSIS — S51811A Laceration without foreign body of right forearm, initial encounter: Secondary | ICD-10-CM | POA: Diagnosis not present

## 2017-04-01 DIAGNOSIS — W1849XA Other slipping, tripping and stumbling without falling, initial encounter: Secondary | ICD-10-CM | POA: Diagnosis not present

## 2017-04-04 DIAGNOSIS — X32XXXD Exposure to sunlight, subsequent encounter: Secondary | ICD-10-CM | POA: Diagnosis not present

## 2017-04-04 DIAGNOSIS — C44519 Basal cell carcinoma of skin of other part of trunk: Secondary | ICD-10-CM | POA: Diagnosis not present

## 2017-04-04 DIAGNOSIS — L570 Actinic keratosis: Secondary | ICD-10-CM | POA: Diagnosis not present

## 2017-04-05 ENCOUNTER — Emergency Department (HOSPITAL_COMMUNITY)
Admission: EM | Admit: 2017-04-05 | Discharge: 2017-04-05 | Disposition: A | Discharge status: Home / Self Care | Payer: Medicare Other | Attending: Emergency Medicine | Admitting: Emergency Medicine

## 2017-04-05 ENCOUNTER — Emergency Department (HOSPITAL_COMMUNITY): Payer: Medicare Other

## 2017-04-05 ENCOUNTER — Encounter (HOSPITAL_COMMUNITY): Payer: Self-pay

## 2017-04-05 DIAGNOSIS — Z85038 Personal history of other malignant neoplasm of large intestine: Secondary | ICD-10-CM | POA: Diagnosis not present

## 2017-04-05 DIAGNOSIS — S7001XA Contusion of right hip, initial encounter: Secondary | ICD-10-CM | POA: Diagnosis not present

## 2017-04-05 DIAGNOSIS — S79911A Unspecified injury of right hip, initial encounter: Secondary | ICD-10-CM | POA: Diagnosis present

## 2017-04-05 DIAGNOSIS — Y92410 Unspecified street and highway as the place of occurrence of the external cause: Secondary | ICD-10-CM | POA: Insufficient documentation

## 2017-04-05 DIAGNOSIS — W101XXA Fall (on)(from) sidewalk curb, initial encounter: Secondary | ICD-10-CM | POA: Diagnosis not present

## 2017-04-05 DIAGNOSIS — I480 Paroxysmal atrial fibrillation: Secondary | ICD-10-CM | POA: Insufficient documentation

## 2017-04-05 DIAGNOSIS — Z86718 Personal history of other venous thrombosis and embolism: Secondary | ICD-10-CM | POA: Diagnosis not present

## 2017-04-05 DIAGNOSIS — M25561 Pain in right knee: Secondary | ICD-10-CM | POA: Diagnosis not present

## 2017-04-05 DIAGNOSIS — E785 Hyperlipidemia, unspecified: Secondary | ICD-10-CM | POA: Diagnosis not present

## 2017-04-05 DIAGNOSIS — Z87891 Personal history of nicotine dependence: Secondary | ICD-10-CM | POA: Diagnosis not present

## 2017-04-05 DIAGNOSIS — Y998 Other external cause status: Secondary | ICD-10-CM | POA: Diagnosis not present

## 2017-04-05 DIAGNOSIS — S7011XA Contusion of right thigh, initial encounter: Secondary | ICD-10-CM | POA: Diagnosis not present

## 2017-04-05 DIAGNOSIS — Y939 Activity, unspecified: Secondary | ICD-10-CM | POA: Diagnosis not present

## 2017-04-05 DIAGNOSIS — Z79899 Other long term (current) drug therapy: Secondary | ICD-10-CM | POA: Diagnosis not present

## 2017-04-05 DIAGNOSIS — I2510 Atherosclerotic heart disease of native coronary artery without angina pectoris: Secondary | ICD-10-CM | POA: Diagnosis not present

## 2017-04-05 DIAGNOSIS — M1611 Unilateral primary osteoarthritis, right hip: Secondary | ICD-10-CM | POA: Diagnosis not present

## 2017-04-05 LAB — PROTIME-INR
INR: 2.81
Prothrombin Time: 30.2 seconds — ABNORMAL HIGH (ref 11.4–15.2)

## 2017-04-05 LAB — CBC WITH DIFFERENTIAL/PLATELET
Basophils Absolute: 0 10*3/uL (ref 0.0–0.1)
Basophils Relative: 0 %
Eosinophils Absolute: 0 10*3/uL (ref 0.0–0.7)
Eosinophils Relative: 0 %
HCT: 31.5 % — ABNORMAL LOW (ref 39.0–52.0)
Hemoglobin: 10.3 g/dL — ABNORMAL LOW (ref 13.0–17.0)
Lymphocytes Relative: 12 %
Lymphs Abs: 1.2 10*3/uL (ref 0.7–4.0)
MCH: 29.4 pg (ref 26.0–34.0)
MCHC: 32.7 g/dL (ref 30.0–36.0)
MCV: 90 fL (ref 78.0–100.0)
Monocytes Absolute: 1 10*3/uL (ref 0.1–1.0)
Monocytes Relative: 10 %
Neutro Abs: 7.5 10*3/uL (ref 1.7–7.7)
Neutrophils Relative %: 78 %
Platelets: 185 10*3/uL (ref 150–400)
RBC: 3.5 MIL/uL — ABNORMAL LOW (ref 4.22–5.81)
RDW: 14.3 % (ref 11.5–15.5)
WBC: 9.7 10*3/uL (ref 4.0–10.5)

## 2017-04-05 NOTE — ED Notes (Signed)
dsg placed on skin tear to right FA after it was cleaned ,

## 2017-04-05 NOTE — ED Triage Notes (Signed)
Pt presents to the ed with complaints of falling last Sunday, he had a bad cut on his right arm and went to urgent care to get that taken care of. He has not had any other complaints and started having right hip and knee pain yesterday and is now having problems ambulating. The patient takes coumadin, denies hitting his head or any loc.

## 2017-04-05 NOTE — ED Notes (Signed)
Pt given water to drink per [place], Therapist, sports

## 2017-04-05 NOTE — ED Provider Notes (Signed)
[facility] DEPT Provider Note   CSN: [id number] Arrival date & time: 04/05/17  0906     History   Chief Complaint Chief Complaint  Patient presents with  . Fall    HPI Darryl Hall is a 83 y.o. male who presents with right hip and knee pain. Past medical history significant for coronary artery disease, PAF on Coumadin, hx of kidney cancer. He states he tripped over a curb last Sunday onto his right side. He was evaluated at urgent care and was treated for skin tears on his right arm. He had no right hip or right knee pain after the fall. He states last night after he got home from a dance (was "slow Jitterbugging") he started having right hip and knee pain. He slept in a recliner last night because of the pain. Today he woke up and the pain was still there and he has had difficulty walking therefore he came to the ED. He denies weakness, numbness, tingling, or swelling in the RLE.  HPI  Past Medical History:  Diagnosis Date  . Anginal pain ([place])   . Arthritis   . CAD (coronary artery disease)   . colon ca dx'd 1998 & 2002   colon resection x 2   . Colon polyp   . Dyslipidemia   . Dysrhythmia    Paroxysmal atrial fibrillation    . Gum disease    or dentures  . Hernia   . History of DVT of lower extremity   . PAF (paroxysmal atrial fibrillation) ([place])    H/O  . Sinus node dysfunction ([facility])   . Systemic hypertension     Patient Active Problem List   Diagnosis Date Noted  . Papillary transitional cell carcinoma, renal ([place])   . Renal carcinoma ([place])   . Right renal mass   . Right kidney mass   . CAD (coronary artery disease) 10/15/2013  . History of recurrent DVT of lower extremity (deep venous thrombosis) 10/15/2013  . Essential hypertension 10/15/2013  . Hyperlipidemia 10/15/2013  . Paroxysmal atrial fibrillation ([place]) 10/15/2013  . Blindness of left eye 10/15/2013  . Sinus node dysfunction ([place]) 10/15/2013  . Postop check 05/13/2011    Past Surgical  History:  Procedure Laterality Date  . APPENDECTOMY    . cardiac bypass  07/12/2002   LIMA to LAD,SVG to first diagonal,SVG to obtuse marginal one and two, SVG to conus branch & posterior descending.  Marland Kitchen CARDIAC CATHETERIZATION  07/08/2002   three vessel  CAD  . COLON SURGERY     cancer  . HERNIA REPAIR  2012  . IR GENERIC HISTORICAL  06/06/2016   IR RADIOLOGIST EVAL & MGMT 06/06/2016 Aletta Edouard, MD [facility] INTERV RAD  . VENTRAL HERNIA REPAIR  2012       Home Medications    Prior to Admission medications   Medication Sig Start Date End Date Taking? Authorizing Provider  Calcium Carb-Cholecalciferol (CALCIUM 600 + D PO) Take 1 tablet by mouth 2 (two) times daily.    [provider]  gabapentin (NEURONTIN) 100 MG capsule Take 200 mg by mouth 2 (two) times daily.    [provider]  lisinopril (PRINIVIL,ZESTRIL) 20 MG tablet Take 1 tablet (20 mg total) by mouth daily. PLEASE CONTACT OFFICE FOR ADDITIONAL REFILLS 2ND ATTEMPT 06/03/16   Croitoru, Dani Gobble, MD  lovastatin (MEVACOR) 20 MG tablet Take 40 mg by mouth 2 (two) times daily.    [provider]  Multiple Vitamins-Minerals (PRESERVISION AREDS 2 PO) Take 1  tablet by mouth 2 (two) times daily.     [provider]  Omega-3 Fatty Acids (FISH OIL) 500 MG CAPS Take 1 capsule by mouth daily.    [provider]  traMADol (ULTRAM) 50 MG tablet Take 50 mg by mouth 2 (two) times daily.    [provider]  warfarin (COUMADIN) 3 MG tablet Take 3-4.5 mg by mouth daily. Takes 3 mg on Sunday, Monday, Wednesday, and Friday. Takes 4.5mg  on Tuesday, Thursday and Saturday    [provider]    Family History Family History  Problem Relation Age of Onset  . Heart disease Father   . Cancer Brother        pt unaware of what kind    Social History Social History  Substance Use Topics  . Smoking status: Former Research scientist (life sciences)  . Smokeless tobacco: Never Used  . Alcohol use No     Allergies     Patient has no known allergies.   Review of Systems Review of Systems  Respiratory: Negative for shortness of breath.   Cardiovascular: Negative for chest pain.  Musculoskeletal: Positive for arthralgias and gait problem. Negative for joint swelling.  Skin: Negative for wound.  Neurological: Negative for weakness and numbness.  Hematological: Bruises/bleeds easily.  All other systems reviewed and are negative.    Physical Exam Updated Vital Signs BP 94/67   Pulse (!) 42   Temp 97.6 F (36.4 C) (Oral)   Resp 16   Ht 5\' 8"  (1.727 m)   Wt 72.6 kg (160 lb)   SpO2 97%   BMI 24.33 kg/m   Physical Exam  Constitutional: He is oriented to person, place, and time. He appears well-developed and well-nourished. No distress.  HENT:  Head: Normocephalic and atraumatic.  Eyes: Conjunctivae are normal. Pupils are equal, round, and reactive to light. Right eye exhibits no discharge. Left eye exhibits no discharge. No scleral icterus.  Neck: Normal range of motion.  Cardiovascular: Bradycardia present.   Pulmonary/Chest: Effort normal. No respiratory distress.  Abdominal: He exhibits no distension.  Musculoskeletal:  Right hip: Large area of ecchymosis over right lateral/posterior hip with tenderness to palpation.   Right knee: No obvious swelling, deformity, or warmth. Mild tenderness to palpation of knee cap. FROM. N/V intact.    Neurological: He is alert and oriented to person, place, and time.  Skin: Skin is warm and dry.  Psychiatric: He has a normal mood and affect. His behavior is normal.  Nursing note and vitals reviewed.    ED Treatments / Results  Labs (all labs ordered are listed, but only abnormal results are displayed) Labs Reviewed  CBC WITH DIFFERENTIAL/PLATELET - Abnormal; Notable for the following:       Result Value   RBC 3.50 (*)    Hemoglobin 10.3 (*)    HCT 31.5 (*)    All other components within normal limits  PROTIME-INR - Abnormal; Notable for the  following:    Prothrombin Time 30.2 (*)    All other components within normal limits    EKG  EKG Interpretation None       Radiology Dg Hip Unilat  With Pelvis 2-3 Views Right  Result Date: 04/05/2017 CLINICAL DATA:  83 year old male with right hip and right knee pain EXAM: DG HIP (WITH OR WITHOUT PELVIS) 2-3V RIGHT COMPARISON:  None FINDINGS: No evidence of acute fracture or malalignment. Moderately advanced right hip degenerative osteoarthritis with loss of joint space, subchondral sclerosis and osteophyte formation. No lytic or  blastic osseous lesion. Advanced degenerative disc disease and facet arthropathy present in the visualized lower lumbar spine. Prostatic calcifications are noted incidentally. Atherosclerotic vascular calcifications are also present. IMPRESSION: 1. Moderately advanced right hip joint osteoarthritis. 2. Lower lumbar degenerative disc disease and facet arthropathy. 3. Atherosclerotic vascular calcifications. 4. Prostatic calcifications. Electronically Signed   By: Jacqulynn Cadet M.D.   On: 04/05/2017 10:15    Procedures Procedures (including critical care time)  Medications Ordered in ED Medications - No data to display   Initial Impression / Assessment and Plan / ED Course  I have reviewed the triage vital signs and the nursing notes.  Pertinent labs & imaging results that were available during my care of the patient were reviewed by me and considered in my medical decision making (see chart for details).  83 year old male presents with large contusion of R hip which is likely due to Coumadin. His hgb is 10.3 and INR is therapeutic at 2.81 today. Xray shows moderate arthritis of the hip but no acute fracture. Shared visit with Dr. Rex Kras. Will d/c with close PCP f/u. Return precautions were given.  Final Clinical Impressions(s) / ED Diagnoses   Final diagnoses:  Contusion of hip and thigh, right, initial encounter  Acute pain of right knee    New  Prescriptions New Prescriptions   No medications on file     Iris Pert 04/05/17 1813    Little, Wenda Overland, MD 04/06/17 1122

## 2017-04-10 ENCOUNTER — Encounter (HOSPITAL_COMMUNITY): Payer: Self-pay | Admitting: Emergency Medicine

## 2017-04-10 ENCOUNTER — Emergency Department (HOSPITAL_COMMUNITY): Payer: Medicare Other

## 2017-04-10 ENCOUNTER — Emergency Department (HOSPITAL_COMMUNITY)
Admission: EM | Admit: 2017-04-10 | Discharge: 2017-04-10 | Disposition: A | Discharge status: Home / Self Care | Payer: Medicare Other | Attending: Emergency Medicine | Admitting: Emergency Medicine

## 2017-04-10 DIAGNOSIS — Z87891 Personal history of nicotine dependence: Secondary | ICD-10-CM | POA: Diagnosis not present

## 2017-04-10 DIAGNOSIS — Z79899 Other long term (current) drug therapy: Secondary | ICD-10-CM | POA: Diagnosis not present

## 2017-04-10 DIAGNOSIS — R51 Headache: Secondary | ICD-10-CM | POA: Diagnosis present

## 2017-04-10 DIAGNOSIS — S8011XA Contusion of right lower leg, initial encounter: Secondary | ICD-10-CM | POA: Diagnosis not present

## 2017-04-10 DIAGNOSIS — W010XXA Fall on same level from slipping, tripping and stumbling without subsequent striking against object, initial encounter: Secondary | ICD-10-CM | POA: Insufficient documentation

## 2017-04-10 DIAGNOSIS — I2510 Atherosclerotic heart disease of native coronary artery without angina pectoris: Secondary | ICD-10-CM | POA: Diagnosis not present

## 2017-04-10 DIAGNOSIS — Y998 Other external cause status: Secondary | ICD-10-CM | POA: Insufficient documentation

## 2017-04-10 DIAGNOSIS — Y929 Unspecified place or not applicable: Secondary | ICD-10-CM | POA: Diagnosis not present

## 2017-04-10 DIAGNOSIS — T148XXA Other injury of unspecified body region, initial encounter: Secondary | ICD-10-CM

## 2017-04-10 DIAGNOSIS — S300XXA Contusion of lower back and pelvis, initial encounter: Secondary | ICD-10-CM | POA: Diagnosis not present

## 2017-04-10 DIAGNOSIS — M25551 Pain in right hip: Secondary | ICD-10-CM | POA: Diagnosis not present

## 2017-04-10 DIAGNOSIS — Y9341 Activity, dancing: Secondary | ICD-10-CM | POA: Diagnosis not present

## 2017-04-10 DIAGNOSIS — S300XXD Contusion of lower back and pelvis, subsequent encounter: Secondary | ICD-10-CM | POA: Diagnosis not present

## 2017-04-10 DIAGNOSIS — Y9289 Other specified places as the place of occurrence of the external cause: Secondary | ICD-10-CM | POA: Diagnosis not present

## 2017-04-10 LAB — COMPREHENSIVE METABOLIC PANEL
ALT: 10 U/L — ABNORMAL LOW (ref 17–63)
AST: 16 U/L (ref 15–41)
Albumin: 3.4 g/dL — ABNORMAL LOW (ref 3.5–5.0)
Alkaline Phosphatase: 48 U/L (ref 38–126)
Anion gap: 6 (ref 5–15)
BUN: 46 mg/dL — ABNORMAL HIGH (ref 6–20)
CO2: 26 mmol/L (ref 22–32)
Calcium: 8.8 mg/dL — ABNORMAL LOW (ref 8.9–10.3)
Chloride: 107 mmol/L (ref 101–111)
Creatinine, Ser: 1.59 mg/dL — ABNORMAL HIGH (ref 0.61–1.24)
GFR calc Af Amer: 43 mL/min — ABNORMAL LOW (ref >60)
GFR calc non Af Amer: 37 mL/min — ABNORMAL LOW (ref >60)
Glucose, Bld: 117 mg/dL — ABNORMAL HIGH (ref 65–99)
Potassium: 5 mmol/L (ref 3.5–5.1)
Sodium: 139 mmol/L (ref 135–145)
Total Bilirubin: 1.4 mg/dL — ABNORMAL HIGH (ref 0.3–1.2)
Total Protein: 6.2 g/dL — ABNORMAL LOW (ref 6.5–8.1)

## 2017-04-10 LAB — CBC WITH DIFFERENTIAL/PLATELET
Basophils Absolute: 0 10*3/uL (ref 0.0–0.1)
Basophils Relative: 0 %
Eosinophils Absolute: 0.1 10*3/uL (ref 0.0–0.7)
Eosinophils Relative: 1 %
HCT: 25.7 % — ABNORMAL LOW (ref 39.0–52.0)
Hemoglobin: 8.3 g/dL — ABNORMAL LOW (ref 13.0–17.0)
Lymphocytes Relative: 8 %
Lymphs Abs: 0.6 10*3/uL — ABNORMAL LOW (ref 0.7–4.0)
MCH: 29.3 pg (ref 26.0–34.0)
MCHC: 32.3 g/dL (ref 30.0–36.0)
MCV: 90.8 fL (ref 78.0–100.0)
Monocytes Absolute: 0.8 10*3/uL (ref 0.1–1.0)
Monocytes Relative: 11 %
Neutro Abs: 5.9 10*3/uL (ref 1.7–7.7)
Neutrophils Relative %: 80 %
Platelets: 242 10*3/uL (ref 150–400)
RBC: 2.83 MIL/uL — ABNORMAL LOW (ref 4.22–5.81)
RDW: 14.2 % (ref 11.5–15.5)
WBC: 7.4 10*3/uL (ref 4.0–10.5)

## 2017-04-10 LAB — PROTIME-INR
INR: 3.33
Prothrombin Time: 34.6 seconds — ABNORMAL HIGH (ref 11.4–15.2)

## 2017-04-10 MED ORDER — HYDROCODONE-ACETAMINOPHEN 5-325 MG PO TABS: 2.0000 | ORAL_TABLET | ORAL | 0 refills | Status: AC | PRN

## 2017-04-10 NOTE — Discharge Instructions (Signed)
Recheck at Urgent care tomorrow for repeat hemoglobin.  Hold coumadin dosage today and tomorrow.

## 2017-04-10 NOTE — ED Provider Notes (Signed)
Darryl Hall Provider Note   CSN: [id number] Arrival date & time: 04/10/17  0998     History   Chief Complaint Chief Complaint  Patient presents with  . Fall  . Headache  . Hip Pain  . Knee Pain    HPI Darryl Hall is a 83 y.o. male.  The history is provided by the patient. No language interpreter was used.  Fall  This is a new problem. The current episode started more than 1 week ago. The problem occurs constantly. The problem has been gradually worsening. Pertinent negatives include no headaches. The symptoms are aggravated by walking. Nothing relieves the symptoms. He has tried nothing for the symptoms. The treatment provided no relief.  Headache    Hip Pain  Pertinent negatives include no headaches.  Knee Pain    Pt reports he fell a week ago.  Pt went dancing after fall and began having swelling to right buttock and right hip.  Pt reports increased swelling.  Pt reports swelling goes down his leg now.  Pt reports increasing pain and discomfort with walking  Past Medical History:  Diagnosis Date  . Anginal pain ([place])   . Arthritis   . CAD (coronary artery disease)   . colon ca dx'd 1998 & 2002   colon resection x 2   . Colon polyp   . Dyslipidemia   . Dysrhythmia    Paroxysmal atrial fibrillation    . Gum disease    or dentures  . Hernia   . History of DVT of lower extremity   . PAF (paroxysmal atrial fibrillation) ([place])    H/O  . Sinus node dysfunction ([facility])   . Systemic hypertension     Patient Active Problem List   Diagnosis Date Noted  . Papillary transitional cell carcinoma, renal ([place])   . Renal carcinoma ([place])   . Right renal mass   . Right kidney mass   . CAD (coronary artery disease) 10/15/2013  . History of recurrent DVT of lower extremity (deep venous thrombosis) 10/15/2013  . Essential hypertension 10/15/2013  . Hyperlipidemia 10/15/2013  . Paroxysmal atrial fibrillation ([place]) 10/15/2013  . Blindness of left eye 10/15/2013  .  Sinus node dysfunction ([place]) 10/15/2013  . Postop check 05/13/2011    Past Surgical History:  Procedure Laterality Date  . APPENDECTOMY    . cardiac bypass  07/12/2002   LIMA to LAD,SVG to first diagonal,SVG to obtuse marginal one and two, SVG to conus branch & posterior descending.  Marland Kitchen CARDIAC CATHETERIZATION  07/08/2002   three vessel  CAD  . COLON SURGERY     cancer  . HERNIA REPAIR  2012  . IR GENERIC HISTORICAL  06/06/2016   IR RADIOLOGIST EVAL & MGMT 06/06/2016 Darryl Edouard, MD [facility] INTERV RAD  . VENTRAL HERNIA REPAIR  2012       Home Medications    Prior to Admission medications   Medication Sig Start Date End Date Taking? Authorizing Provider  acetaminophen (TYLENOL) 325 MG tablet Take 650 mg by mouth every 6 (six) hours as needed for mild pain.   Yes [provider]  Ascorbic Acid (VITAMIN C) 100 MG tablet Take 100 mg by mouth daily.   Yes [provider]  brimonidine-timolol (COMBIGAN) 0.2-0.5 % ophthalmic solution Place 1 drop into the right eye every 12 (twelve) hours.   Yes [provider]  gabapentin (NEURONTIN) 100 MG capsule Take 200 mg by mouth 2 (two) times daily.   Yes [provider]  lisinopril (PRINIVIL,ZESTRIL) 20 MG tablet Take 1 tablet (20 mg total) by mouth daily. PLEASE CONTACT OFFICE FOR ADDITIONAL REFILLS 2ND ATTEMPT 06/03/16  Yes Hall, Mihai, MD  lovastatin (MEVACOR) 20 MG tablet Take 40 mg by mouth daily.    Yes [provider]  Multiple Vitamins-Minerals (PRESERVISION AREDS 2 PO) Take 1 tablet by mouth 2 (two) times daily.    Yes [provider]  Omega-3 Fatty Acids (FISH OIL) 500 MG CAPS Take 1 capsule by mouth daily.   Yes [provider]  traMADol (ULTRAM) 50 MG tablet Take 50 mg by mouth 2 (two) times daily.   Yes [provider]  warfarin (COUMADIN) 3 MG tablet Take 3-4.5 mg by mouth daily. Takes 3 mg on Sunday, Monday, Wednesday, and Friday. Takes 4.5mg  on Tuesday,  Thursday and Saturday   Yes [provider]  HYDROcodone-acetaminophen (NORCO/VICODIN) 5-325 MG tablet Take 2 tablets by mouth every 4 (four) hours as needed. 04/10/17   Fransico Meadow, PA-C    Family History Family History  Problem Relation Age of Onset  . Heart disease Father   . Cancer Brother        pt unaware of what kind    Social History Social History  Substance Use Topics  . Smoking status: Former Research scientist (life sciences)  . Smokeless tobacco: Never Used  . Alcohol use No     Allergies   Patient has no known allergies.   Review of Systems Review of Systems  Neurological: Negative for headaches.  All other systems reviewed and are negative.    Physical Exam Updated Vital Signs BP (!) 141/69 (BP Location: Left Arm)   Pulse 97   Temp 98.3 F (36.8 C) (Oral)   Resp 18   Ht 5\' 8"  (1.727 m)   Wt 72.6 kg (160 lb)   SpO2 100%   BMI 24.33 kg/m   Physical Exam  HENT:  Head: Normocephalic.  Eyes: Pupils are equal, round, and reactive to light.  Neck: Normal range of motion.  Cardiovascular: Normal rate and regular rhythm.   Pulmonary/Chest: Effort normal and breath sounds normal.  Abdominal: Soft.  Musculoskeletal:  Large hematoma right buttock and right leg,  Discolored swelling to knee and lower leg.   Neurological: He is alert.  Skin: Skin is warm.  Psychiatric: He has a normal mood and affect. Thought content normal.  Nursing note and vitals reviewed.    ED Treatments / Results  Labs (all labs ordered are listed, but only abnormal results are displayed) Labs Reviewed  CBC WITH DIFFERENTIAL/PLATELET - Abnormal; Notable for the following:       Result Value   RBC 2.83 (*)    Hemoglobin 8.3 (*)    HCT 25.7 (*)    Lymphs Abs 0.6 (*)    All other components within normal limits  PROTIME-INR - Abnormal; Notable for the following:    Prothrombin Time 34.6 (*)    All other components within normal limits  COMPREHENSIVE METABOLIC PANEL - Abnormal; Notable  for the following:    Glucose, Bld 117 (*)    BUN 46 (*)    Creatinine, Ser 1.59 (*)    Calcium 8.8 (*)    Total Protein 6.2 (*)    Albumin 3.4 (*)    ALT 10 (*)    Total Bilirubin 1.4 (*)    GFR calc non Af Amer 37 (*)    GFR calc Af Amer 43 (*)    All other components within normal limits  EKG  EKG Interpretation None       Radiology Ct Hip Right Wo Contrast  Result Date: 04/10/2017 CLINICAL DATA:  Right hip pain since a fall onto the hip 2 weeks ago. Subsequent encounter. EXAM: CT OF THE RIGHT HIP WITHOUT CONTRAST TECHNIQUE: Multidetector CT imaging of the right hip was performed according to the standard protocol. Multiplanar CT image reconstructions were also generated. COMPARISON:  Plain films right hip 04/05/2017. FINDINGS: Bones/Joint/Cartilage No acute bony or joint abnormality is identified. The patient has advanced right hip osteoarthritis with bone-on-bone joint space narrowing and osteophytosis present. Loss of disc space height and vacuum disc phenomenon are noted at L4-5 and L5-S1. There is no avascular necrosis of the femoral head or lytic lesion. Ligaments Suboptimally assessed by CT. Muscles and Tendons The right gluteus medius is enlarged with a rounded hyperattenuating collection within it consistent with a hematoma measuring 8.8 cm craniocaudal by 8.1 cm transverse by 6 cm AP. No muscle or tendon tear is identified. Soft tissues Infiltration subcutaneous fat is seen about the right buttock. Extensive atherosclerotic vascular disease identified. Prostate calcifications are noted. IMPRESSION: Large hematoma centered in the right gluteus medius. Negative for acute bony abnormality. Advanced right hip osteoarthritis. Atherosclerosis. Electronically Signed   By: Inge Rise M.D.   On: 04/10/2017 10:30    Procedures Procedures (including critical care time)  Medications Ordered in ED Medications - No data to display   Initial Impression / Assessment and Plan /  ED Course  I have reviewed the triage vital signs and the nursing notes.  Pertinent labs & imaging results that were available during my care of the patient were reviewed by me and considered in my medical decision making (see chart for details).     Pt has had 2 point decrease in his hemoglobin since 6/30.  Pt advised to hold coumadin today and tomorrow.  Pt advised to see his MD or go to Urgent care tomorrow for repeat hemoglobin.  I do not think pt has current active bleeding but due to his age and anticoagulation I feel he needs to be monitored closely   Final Clinical Impressions(s) / ED Diagnoses   Final diagnoses:  Hematoma and contusion  Traumatic hematoma of buttock, subsequent encounter    New Prescriptions Discharge Medication List as of 04/10/2017 12:38 PM    An After Visit Summary was printed and given to the patient.   Fransico Meadow, [place] 04/10/17 1523    Carmin Muskrat, MD 04/11/17 [id number]

## 2017-04-10 NOTE — ED Notes (Signed)
Pt. Takes Coumadin

## 2017-04-10 NOTE — ED Notes (Signed)
Pt taken to CT.

## 2017-04-10 NOTE — ED Triage Notes (Signed)
Friend/pt. Stated, He fell about 2 weeks ago.I fell 1st and then I went dancing. After that he could hardly move. I was here June 30, and it s no better the pain and swelling.

## 2017-04-11 DIAGNOSIS — I10 Essential (primary) hypertension: Secondary | ICD-10-CM | POA: Diagnosis not present

## 2017-04-11 DIAGNOSIS — Z7901 Long term (current) use of anticoagulants: Secondary | ICD-10-CM | POA: Diagnosis not present

## 2017-04-17 DIAGNOSIS — D649 Anemia, unspecified: Secondary | ICD-10-CM | POA: Diagnosis not present

## 2017-04-24 DIAGNOSIS — Z5181 Encounter for therapeutic drug level monitoring: Secondary | ICD-10-CM | POA: Diagnosis not present

## 2017-04-24 DIAGNOSIS — Z7901 Long term (current) use of anticoagulants: Secondary | ICD-10-CM | POA: Diagnosis not present

## 2017-04-24 DIAGNOSIS — D649 Anemia, unspecified: Secondary | ICD-10-CM | POA: Diagnosis not present

## 2017-05-05 DIAGNOSIS — H353211 Exudative age-related macular degeneration, right eye, with active choroidal neovascularization: Secondary | ICD-10-CM | POA: Diagnosis not present

## 2017-05-05 DIAGNOSIS — H401111 Primary open-angle glaucoma, right eye, mild stage: Secondary | ICD-10-CM | POA: Diagnosis not present

## 2017-05-05 DIAGNOSIS — H4051X3 Glaucoma secondary to other eye disorders, right eye, severe stage: Secondary | ICD-10-CM | POA: Diagnosis not present

## 2017-05-05 DIAGNOSIS — H353112 Nonexudative age-related macular degeneration, right eye, intermediate dry stage: Secondary | ICD-10-CM | POA: Diagnosis not present

## 2017-05-08 DIAGNOSIS — I4891 Unspecified atrial fibrillation: Secondary | ICD-10-CM | POA: Diagnosis not present

## 2017-05-08 DIAGNOSIS — Z7901 Long term (current) use of anticoagulants: Secondary | ICD-10-CM | POA: Diagnosis not present

## 2017-05-14 DIAGNOSIS — I2510 Atherosclerotic heart disease of native coronary artery without angina pectoris: Secondary | ICD-10-CM | POA: Diagnosis not present

## 2017-05-14 DIAGNOSIS — E782 Mixed hyperlipidemia: Secondary | ICD-10-CM | POA: Diagnosis not present

## 2017-05-14 DIAGNOSIS — Z0000 Encounter for general adult medical examination without abnormal findings: Secondary | ICD-10-CM | POA: Diagnosis not present

## 2017-05-19 ENCOUNTER — Other Ambulatory Visit (HOSPITAL_COMMUNITY): Payer: Self-pay | Admitting: Interventional Radiology

## 2017-05-19 ENCOUNTER — Other Ambulatory Visit: Payer: Self-pay | Admitting: *Deleted

## 2017-05-19 DIAGNOSIS — I2510 Atherosclerotic heart disease of native coronary artery without angina pectoris: Secondary | ICD-10-CM | POA: Diagnosis not present

## 2017-05-19 DIAGNOSIS — I129 Hypertensive chronic kidney disease with stage 1 through stage 4 chronic kidney disease, or unspecified chronic kidney disease: Secondary | ICD-10-CM | POA: Diagnosis not present

## 2017-05-19 DIAGNOSIS — E782 Mixed hyperlipidemia: Secondary | ICD-10-CM | POA: Diagnosis not present

## 2017-05-19 DIAGNOSIS — C649 Malignant neoplasm of unspecified kidney, except renal pelvis: Secondary | ICD-10-CM

## 2017-05-19 DIAGNOSIS — N2889 Other specified disorders of kidney and ureter: Secondary | ICD-10-CM

## 2017-05-19 DIAGNOSIS — I4891 Unspecified atrial fibrillation: Secondary | ICD-10-CM | POA: Diagnosis not present

## 2017-05-19 DIAGNOSIS — Z23 Encounter for immunization: Secondary | ICD-10-CM | POA: Diagnosis not present

## 2017-06-11 DIAGNOSIS — H401111 Primary open-angle glaucoma, right eye, mild stage: Secondary | ICD-10-CM | POA: Diagnosis not present

## 2017-06-11 DIAGNOSIS — H353112 Nonexudative age-related macular degeneration, right eye, intermediate dry stage: Secondary | ICD-10-CM | POA: Diagnosis not present

## 2017-06-11 DIAGNOSIS — H35721 Serous detachment of retinal pigment epithelium, right eye: Secondary | ICD-10-CM | POA: Diagnosis not present

## 2017-06-11 DIAGNOSIS — H4051X3 Glaucoma secondary to other eye disorders, right eye, severe stage: Secondary | ICD-10-CM | POA: Diagnosis not present

## 2017-06-23 DIAGNOSIS — Z7901 Long term (current) use of anticoagulants: Secondary | ICD-10-CM | POA: Diagnosis not present

## 2017-06-23 DIAGNOSIS — Z23 Encounter for immunization: Secondary | ICD-10-CM | POA: Diagnosis not present

## 2017-06-23 DIAGNOSIS — I4891 Unspecified atrial fibrillation: Secondary | ICD-10-CM | POA: Diagnosis not present

## 2017-06-23 DIAGNOSIS — N2889 Other specified disorders of kidney and ureter: Secondary | ICD-10-CM | POA: Diagnosis not present

## 2017-06-23 LAB — BUN: BUN: 25 mg/dL (ref 7–25)

## 2017-06-23 LAB — CREATININE WITH EST GFR
Creat: 1.11 mg/dL (ref 0.70–1.11)
GFR, Est African American: 68 mL/min/{1.73_m2} (ref >=60)
GFR, Est Non African American: 59 mL/min/{1.73_m2} — ABNORMAL LOW (ref >=60)

## 2017-07-02 ENCOUNTER — Ambulatory Visit
Admission: RE | Admit: 2017-07-02 | Discharge: 2017-07-02 | Disposition: A | Discharge status: Home / Self Care | Payer: Medicare Other | Source: Ambulatory Visit | Attending: Interventional Radiology | Admitting: Interventional Radiology

## 2017-07-02 ENCOUNTER — Ambulatory Visit (HOSPITAL_COMMUNITY)
Admission: RE | Admit: 2017-07-02 | Discharge: 2017-07-02 | Disposition: A | Discharge status: Home / Self Care | Payer: Medicare Other | Source: Ambulatory Visit | Attending: Interventional Radiology | Admitting: Interventional Radiology

## 2017-07-02 DIAGNOSIS — Z9889 Other specified postprocedural states: Secondary | ICD-10-CM | POA: Diagnosis not present

## 2017-07-02 DIAGNOSIS — C649 Malignant neoplasm of unspecified kidney, except renal pelvis: Secondary | ICD-10-CM

## 2017-07-02 DIAGNOSIS — I700 Atherosclerosis of aorta: Secondary | ICD-10-CM | POA: Insufficient documentation

## 2017-07-02 DIAGNOSIS — K8020 Calculus of gallbladder without cholecystitis without obstruction: Secondary | ICD-10-CM | POA: Insufficient documentation

## 2017-07-02 DIAGNOSIS — Z85528 Personal history of other malignant neoplasm of kidney: Secondary | ICD-10-CM | POA: Diagnosis not present

## 2017-07-02 HISTORY — PX: IR RADIOLOGIST EVAL & MGMT: IMG5224

## 2017-07-02 MED ORDER — GADOBENATE DIMEGLUMINE 529 MG/ML IV SOLN
15.0000 mL | Freq: Once | INTRAVENOUS | Status: AC | PRN
  Administered 2017-07-02: 15 mL via INTRAVENOUS

## 2017-07-02 NOTE — Progress Notes (Signed)
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 He has been doing well. He did sustain a right gluteal hematoma after a fall in July. He has now completely recovered. He has no urinary complaints or abdominal pain.  Past Medical History:  Diagnosis Date  . Anginal pain ([place])   . Arthritis   . CAD (coronary artery disease)   . colon ca dx'd 1998 & 2002   colon resection x 2   . Colon polyp   . Dyslipidemia   . Dysrhythmia    Paroxysmal atrial fibrillation    . Gum disease    or dentures  . Hernia   . History of DVT of lower extremity   . PAF (paroxysmal atrial fibrillation) ([place])    H/O  . Sinus node dysfunction (HCC)   . Systemic hypertension     Past Surgical History:  Procedure Laterality Date  . APPENDECTOMY    . cardiac bypass  07/12/2002   LIMA to LAD,SVG to first diagonal,SVG to obtuse marginal one and two, SVG to conus branch & posterior descending.  Marland Kitchen CARDIAC CATHETERIZATION  07/08/2002   three vessel  CAD  . COLON SURGERY     cancer  . HERNIA REPAIR  2012  . IR GENERIC HISTORICAL  06/06/2016   IR RADIOLOGIST EVAL & MGMT 06/06/2016 Aletta Edouard, MD [facility] INTERV RAD  . VENTRAL HERNIA REPAIR  2012    Allergies: Patient has no known allergies.  Medications: Prior to Admission medications   Medication Sig Start Date End Date Taking? Authorizing Provider  acetaminophen (TYLENOL) 325 MG tablet Take 650 mg by mouth every 6 (six) hours as needed for mild pain.   Yes [provider]  Ascorbic Acid (VITAMIN C) 100 MG tablet Take 100 mg by mouth daily.   Yes [provider]  brimonidine-timolol (COMBIGAN) 0.2-0.5 % ophthalmic solution Place 1 drop into the right eye every 12 (twelve) hours.   Yes [provider]  gabapentin (NEURONTIN) 100 MG capsule Take  200 mg by mouth 2 (two) times daily.   Yes [provider]  HYDROcodone-acetaminophen (NORCO/VICODIN) 5-325 MG tablet Take 2 tablets by mouth every 4 (four) hours as needed. 04/10/17  Yes Caryl Ada K, PA-C  lisinopril (PRINIVIL,ZESTRIL) 20 MG tablet Take 1 tablet (20 mg total) by mouth daily. PLEASE CONTACT OFFICE FOR ADDITIONAL REFILLS 2ND ATTEMPT 06/03/16  Yes Croitoru, Mihai, MD  lovastatin (MEVACOR) 20 MG tablet Take 40 mg by mouth daily.    Yes [provider]  Multiple Vitamins-Minerals (PRESERVISION AREDS 2 PO) Take 1 tablet by mouth 2 (two) times daily.    Yes [provider]  Omega-3 Fatty Acids (FISH OIL) 500 MG CAPS Take 1 capsule by mouth daily.   Yes [provider]  traMADol (ULTRAM) 50 MG tablet Take 50 mg by mouth 2 (two) times daily.   Yes [provider]  warfarin (COUMADIN) 3 MG tablet Take 3-4.5 mg by mouth daily. Takes 3 mg on Sunday, Monday, Wednesday, and Friday. Takes 4.5mg  on Tuesday, Thursday and Saturday   Yes [provider]     Family History  Problem Relation Age of Onset  . Heart disease Father   . Cancer Brother        pt unaware of what kind    Social History  Social History  . Marital status: Single    Spouse name: N/A  . Number of children: N/A  . Years of education: N/A   Social History Main Topics  . Smoking status: Former Research scientist (life sciences)  . Smokeless tobacco: Never Used  . Alcohol use No  . Drug use: No  . Sexual activity: Not Asked   Other Topics Concern  . None   Social History Narrative  . None    ECOG Status: 0 - Asymptomatic  Review of Systems: A 12 point ROS discussed and pertinent positives are indicated in the HPI above.  All other systems are negative.  Review of Systems  Constitutional: Negative.   Respiratory: Negative.   Cardiovascular: Negative.   Gastrointestinal: Negative.   Genitourinary: Negative.   Musculoskeletal: Negative.   Neurological: Negative.     Vital  Signs: BP (!) 98/56 (BP Location: Right Arm, Patient Position: Sitting, Cuff Size: Normal)   Pulse 72   Temp 97.7 F (36.5 C)   SpO2 98%   Physical Exam  Constitutional: He is oriented to person, place, and time. No distress.  Abdominal: Soft. He exhibits no distension. There is no tenderness. There is no rebound and no guarding.  Neurological: He is alert and oriented to person, place, and time.  Skin: He is not diaphoretic.  Vitals reviewed.     Imaging: Mr Abdomen Wwo Contrast  Result Date: 07/02/2017 CLINICAL DATA:  Followup renal cell carcinoma. Status post cryo ablation of right kidney lesion. EXAM: MRI ABDOMEN WITHOUT AND WITH CONTRAST TECHNIQUE: Multiplanar multisequence MR imaging of the abdomen was performed both before and after the administration of intravenous contrast. CONTRAST:  81mL MULTIHANCE GADOBENATE DIMEGLUMINE 529 MG/ML IV SOLN COMPARISON:  06/06/16 FINDINGS: Exam detail diminished secondary to respiratory motion artifact. Lower chest: No acute findings. Hepatobiliary: Gallstones are identified measuring up to 8 mm. No gallbladder wall thickening. No biliary dilatation. Similar appearance of scattered liver cysts. No suspicious enhancing liver abnormality identified. Pancreas: No mass, inflammatory changes, or other parenchymal abnormality identified. Spleen:  Within normal limits in size and appearance. Adrenals/Urinary Tract: Stable small right adrenal nodule measuring 9 mm. Likely benign adenoma. The cryoablation zone arising from the posterior cortex of the right kidney is again noted measuring 3.8 x 3.6 cm, image 29 of series 6. Previously 3.9 x 4.0 cm. No abnormal enhancement within the ablation zone to suggest recurrent tumor. Arising from the posterior cortex of the lower pole of right kidney is a 11 mm partially exophytic enhancing structure, unchanged from previous exam, image 73 of series 902. Scattered Bosniak category 1 and 2 cysts identified elsewhere.  Stomach/Bowel: Visualized portions within the abdomen are unremarkable. Vascular/Lymphatic: No pathologically enlarged lymph nodes identified. No abdominal aortic aneurysm demonstrated. Aortic atherosclerosis is again noted. Other:  None. Musculoskeletal: No suspicious bone lesions identified. IMPRESSION: 1. Further decrease in size of right renal cryo ablation zone without evidence for recurrent tumor at this site. 2. Stable 11 mm subcapsular lesion arising from the posterior lower pole of right kidney with evidence of contrast enhancement. Suspicious for small solid renal neoplasm. 3. No findings to suggest metastatic disease. 4. Gallstones. 5.  Aortic Atherosclerosis (ICD10-I70.0). Electronically Signed   By: Kerby Moors M.D.   On: 07/02/2017 09:34    Labs:  CBC:  Recent Labs  04/05/17 1155 04/10/17 0952  WBC 9.7 7.4  HGB 10.3* 8.3*  HCT 31.5* 25.7*  PLT 185 242    COAGS:  Recent Labs  04/05/17 1155 04/10/17 0952  INR 2.81 3.33    BMP:  Recent Labs  04/10/17 0952 06/23/17 1117  NA 139  --   K 5.0  --   CL 107  --   CO2 26  --   GLUCOSE 117*  --   BUN 46* 25  CALCIUM 8.8*  --   CREATININE 1.59* 1.11  GFRNONAA 37* 59*  GFRAA 43* 68    LIVER FUNCTION TESTS:  Recent Labs  04/10/17 0952  BILITOT 1.4*  AST 16  ALT 10*  ALKPHOS 48  PROT 6.2*  ALBUMIN 3.4*    Assessment and Plan:  I reviewed the follow-up MRI of the abdomen performed earlier today which shows further retraction in post ablation scar tissue at the level of the treated right posterior papillary carcinoma. There is no evidence of carcinoma recurrence. A small potentially enhancing cortical lesion along the posterior aspect of the lower pole of the left kidney appear stable and shows no interval growth since November, 2016. Based on the precontrast images, I am not even sure this actually represents a true separate lesion and may be an area of subtle protrusion of normal renal cortex.  Irregardless, given no change in appearance, this will continue to be followed and does not require any treatment or biopsy at this time. I recommended another follow-up MRI in one year.   Electronically SignedAletta Edouard T 07/02/2017, 12:52 PM   I spent a total of 15 Minutes in face to face in clinical consultation, greater than 50% of which was counseling/coordinating care post cryoablation of a right renal papillary carcinoma.

## 2017-07-21 ENCOUNTER — Encounter: Payer: Self-pay | Admitting: Interventional Radiology

## 2017-07-28 DIAGNOSIS — H353112 Nonexudative age-related macular degeneration, right eye, intermediate dry stage: Secondary | ICD-10-CM | POA: Diagnosis not present

## 2017-07-28 DIAGNOSIS — H35721 Serous detachment of retinal pigment epithelium, right eye: Secondary | ICD-10-CM | POA: Diagnosis not present

## 2017-07-28 DIAGNOSIS — H353211 Exudative age-related macular degeneration, right eye, with active choroidal neovascularization: Secondary | ICD-10-CM | POA: Diagnosis not present

## 2017-07-28 DIAGNOSIS — H353222 Exudative age-related macular degeneration, left eye, with inactive choroidal neovascularization: Secondary | ICD-10-CM | POA: Diagnosis not present

## 2017-08-04 DIAGNOSIS — I4891 Unspecified atrial fibrillation: Secondary | ICD-10-CM | POA: Diagnosis not present

## 2017-08-04 DIAGNOSIS — Z7901 Long term (current) use of anticoagulants: Secondary | ICD-10-CM | POA: Diagnosis not present

## 2017-09-17 DIAGNOSIS — H353112 Nonexudative age-related macular degeneration, right eye, intermediate dry stage: Secondary | ICD-10-CM | POA: Diagnosis not present

## 2017-09-17 DIAGNOSIS — H353211 Exudative age-related macular degeneration, right eye, with active choroidal neovascularization: Secondary | ICD-10-CM | POA: Diagnosis not present

## 2017-09-17 DIAGNOSIS — H35721 Serous detachment of retinal pigment epithelium, right eye: Secondary | ICD-10-CM | POA: Diagnosis not present

## 2017-09-17 DIAGNOSIS — H401111 Primary open-angle glaucoma, right eye, mild stage: Secondary | ICD-10-CM | POA: Diagnosis not present

## 2017-09-22 DIAGNOSIS — Z7901 Long term (current) use of anticoagulants: Secondary | ICD-10-CM | POA: Diagnosis not present

## 2017-09-22 DIAGNOSIS — I4891 Unspecified atrial fibrillation: Secondary | ICD-10-CM | POA: Diagnosis not present

## 2017-09-24 DIAGNOSIS — L570 Actinic keratosis: Secondary | ICD-10-CM | POA: Diagnosis not present

## 2017-09-24 DIAGNOSIS — L579 Skin changes due to chronic exposure to nonionizing radiation, unspecified: Secondary | ICD-10-CM | POA: Diagnosis not present

## 2017-09-24 DIAGNOSIS — Z85828 Personal history of other malignant neoplasm of skin: Secondary | ICD-10-CM | POA: Diagnosis not present

## 2017-10-21 DIAGNOSIS — H4051X3 Glaucoma secondary to other eye disorders, right eye, severe stage: Secondary | ICD-10-CM | POA: Diagnosis not present

## 2017-10-21 DIAGNOSIS — H353211 Exudative age-related macular degeneration, right eye, with active choroidal neovascularization: Secondary | ICD-10-CM | POA: Diagnosis not present

## 2017-10-21 DIAGNOSIS — H35721 Serous detachment of retinal pigment epithelium, right eye: Secondary | ICD-10-CM | POA: Diagnosis not present

## 2017-10-21 DIAGNOSIS — H401111 Primary open-angle glaucoma, right eye, mild stage: Secondary | ICD-10-CM | POA: Diagnosis not present

## 2017-11-03 DIAGNOSIS — I4891 Unspecified atrial fibrillation: Secondary | ICD-10-CM | POA: Diagnosis not present

## 2017-11-03 DIAGNOSIS — Z7901 Long term (current) use of anticoagulants: Secondary | ICD-10-CM | POA: Diagnosis not present

## 2017-11-05 DIAGNOSIS — L579 Skin changes due to chronic exposure to nonionizing radiation, unspecified: Secondary | ICD-10-CM | POA: Diagnosis not present

## 2017-11-05 DIAGNOSIS — L570 Actinic keratosis: Secondary | ICD-10-CM | POA: Diagnosis not present

## 2017-11-19 DIAGNOSIS — H353112 Nonexudative age-related macular degeneration, right eye, intermediate dry stage: Secondary | ICD-10-CM | POA: Diagnosis not present

## 2017-11-19 DIAGNOSIS — H35721 Serous detachment of retinal pigment epithelium, right eye: Secondary | ICD-10-CM | POA: Diagnosis not present

## 2017-11-19 DIAGNOSIS — H353211 Exudative age-related macular degeneration, right eye, with active choroidal neovascularization: Secondary | ICD-10-CM | POA: Diagnosis not present

## 2017-11-19 DIAGNOSIS — H26491 Other secondary cataract, right eye: Secondary | ICD-10-CM | POA: Diagnosis not present

## 2017-12-08 DIAGNOSIS — H26491 Other secondary cataract, right eye: Secondary | ICD-10-CM | POA: Diagnosis not present

## 2017-12-08 DIAGNOSIS — H40051 Ocular hypertension, right eye: Secondary | ICD-10-CM | POA: Diagnosis not present

## 2017-12-08 DIAGNOSIS — I2510 Atherosclerotic heart disease of native coronary artery without angina pectoris: Secondary | ICD-10-CM | POA: Diagnosis not present

## 2017-12-08 DIAGNOSIS — E782 Mixed hyperlipidemia: Secondary | ICD-10-CM | POA: Diagnosis not present

## 2017-12-08 DIAGNOSIS — H402223 Chronic angle-closure glaucoma, left eye, severe stage: Secondary | ICD-10-CM | POA: Diagnosis not present

## 2017-12-08 DIAGNOSIS — I4891 Unspecified atrial fibrillation: Secondary | ICD-10-CM | POA: Diagnosis not present

## 2017-12-08 DIAGNOSIS — H402211 Chronic angle-closure glaucoma, right eye, mild stage: Secondary | ICD-10-CM | POA: Diagnosis not present

## 2017-12-10 DIAGNOSIS — H402223 Chronic angle-closure glaucoma, left eye, severe stage: Secondary | ICD-10-CM | POA: Diagnosis not present

## 2017-12-10 DIAGNOSIS — H402211 Chronic angle-closure glaucoma, right eye, mild stage: Secondary | ICD-10-CM | POA: Diagnosis not present

## 2017-12-10 DIAGNOSIS — H40051 Ocular hypertension, right eye: Secondary | ICD-10-CM | POA: Diagnosis not present

## 2017-12-17 DIAGNOSIS — L570 Actinic keratosis: Secondary | ICD-10-CM | POA: Diagnosis not present

## 2017-12-17 DIAGNOSIS — Z85828 Personal history of other malignant neoplasm of skin: Secondary | ICD-10-CM | POA: Diagnosis not present

## 2017-12-17 DIAGNOSIS — L579 Skin changes due to chronic exposure to nonionizing radiation, unspecified: Secondary | ICD-10-CM | POA: Diagnosis not present

## 2017-12-22 DIAGNOSIS — I2510 Atherosclerotic heart disease of native coronary artery without angina pectoris: Secondary | ICD-10-CM | POA: Diagnosis not present

## 2017-12-22 DIAGNOSIS — I4891 Unspecified atrial fibrillation: Secondary | ICD-10-CM | POA: Diagnosis not present

## 2017-12-22 DIAGNOSIS — E782 Mixed hyperlipidemia: Secondary | ICD-10-CM | POA: Diagnosis not present

## 2017-12-22 DIAGNOSIS — N183 Chronic kidney disease, stage 3 (moderate): Secondary | ICD-10-CM | POA: Diagnosis not present

## 2017-12-25 DIAGNOSIS — H353211 Exudative age-related macular degeneration, right eye, with active choroidal neovascularization: Secondary | ICD-10-CM | POA: Diagnosis not present

## 2017-12-25 DIAGNOSIS — H4051X3 Glaucoma secondary to other eye disorders, right eye, severe stage: Secondary | ICD-10-CM | POA: Diagnosis not present

## 2017-12-25 DIAGNOSIS — H353112 Nonexudative age-related macular degeneration, right eye, intermediate dry stage: Secondary | ICD-10-CM | POA: Diagnosis not present

## 2017-12-25 DIAGNOSIS — H35721 Serous detachment of retinal pigment epithelium, right eye: Secondary | ICD-10-CM | POA: Diagnosis not present

## 2018-01-07 DIAGNOSIS — H402211 Chronic angle-closure glaucoma, right eye, mild stage: Secondary | ICD-10-CM | POA: Diagnosis not present

## 2018-01-07 DIAGNOSIS — H402223 Chronic angle-closure glaucoma, left eye, severe stage: Secondary | ICD-10-CM | POA: Diagnosis not present

## 2018-01-07 DIAGNOSIS — I4891 Unspecified atrial fibrillation: Secondary | ICD-10-CM | POA: Diagnosis not present

## 2018-01-07 DIAGNOSIS — Z7901 Long term (current) use of anticoagulants: Secondary | ICD-10-CM | POA: Diagnosis not present

## 2018-01-07 DIAGNOSIS — H353211 Exudative age-related macular degeneration, right eye, with active choroidal neovascularization: Secondary | ICD-10-CM | POA: Diagnosis not present

## 2018-01-07 DIAGNOSIS — H26491 Other secondary cataract, right eye: Secondary | ICD-10-CM | POA: Diagnosis not present

## 2018-02-02 DIAGNOSIS — H43811 Vitreous degeneration, right eye: Secondary | ICD-10-CM | POA: Diagnosis not present

## 2018-02-02 DIAGNOSIS — H353211 Exudative age-related macular degeneration, right eye, with active choroidal neovascularization: Secondary | ICD-10-CM | POA: Diagnosis not present

## 2018-02-02 DIAGNOSIS — H353124 Nonexudative age-related macular degeneration, left eye, advanced atrophic with subfoveal involvement: Secondary | ICD-10-CM | POA: Diagnosis not present

## 2018-02-02 DIAGNOSIS — H401111 Primary open-angle glaucoma, right eye, mild stage: Secondary | ICD-10-CM | POA: Diagnosis not present

## 2018-02-09 DIAGNOSIS — C3490 Malignant neoplasm of unspecified part of unspecified bronchus or lung: Secondary | ICD-10-CM | POA: Diagnosis not present

## 2018-02-09 DIAGNOSIS — Z7901 Long term (current) use of anticoagulants: Secondary | ICD-10-CM | POA: Diagnosis not present

## 2018-02-09 DIAGNOSIS — Z86718 Personal history of other venous thrombosis and embolism: Secondary | ICD-10-CM | POA: Diagnosis not present

## 2018-02-09 DIAGNOSIS — M546 Pain in thoracic spine: Secondary | ICD-10-CM | POA: Diagnosis not present

## 2018-02-13 DIAGNOSIS — H402211 Chronic angle-closure glaucoma, right eye, mild stage: Secondary | ICD-10-CM | POA: Diagnosis not present

## 2018-02-13 DIAGNOSIS — H02402 Unspecified ptosis of left eyelid: Secondary | ICD-10-CM | POA: Diagnosis not present

## 2018-02-13 DIAGNOSIS — H40051 Ocular hypertension, right eye: Secondary | ICD-10-CM | POA: Diagnosis not present

## 2018-02-13 DIAGNOSIS — H402223 Chronic angle-closure glaucoma, left eye, severe stage: Secondary | ICD-10-CM | POA: Diagnosis not present

## 2018-03-05 DIAGNOSIS — I82509 Chronic embolism and thrombosis of unspecified deep veins of unspecified lower extremity: Secondary | ICD-10-CM | POA: Diagnosis not present

## 2018-03-06 DIAGNOSIS — M546 Pain in thoracic spine: Secondary | ICD-10-CM | POA: Diagnosis not present

## 2018-03-06 DIAGNOSIS — C3490 Malignant neoplasm of unspecified part of unspecified bronchus or lung: Secondary | ICD-10-CM | POA: Diagnosis not present

## 2018-03-06 DIAGNOSIS — R05 Cough: Secondary | ICD-10-CM | POA: Diagnosis not present

## 2018-03-06 DIAGNOSIS — G8929 Other chronic pain: Secondary | ICD-10-CM | POA: Diagnosis not present

## 2018-03-06 DIAGNOSIS — C399 Malignant neoplasm of lower respiratory tract, part unspecified: Secondary | ICD-10-CM | POA: Diagnosis not present

## 2018-03-11 DIAGNOSIS — I824Z9 Acute embolism and thrombosis of unspecified deep veins of unspecified distal lower extremity: Secondary | ICD-10-CM | POA: Diagnosis not present

## 2018-03-12 DIAGNOSIS — H4052X3 Glaucoma secondary to other eye disorders, left eye, severe stage: Secondary | ICD-10-CM | POA: Diagnosis not present

## 2018-03-12 DIAGNOSIS — H401113 Primary open-angle glaucoma, right eye, severe stage: Secondary | ICD-10-CM | POA: Diagnosis not present

## 2018-03-12 DIAGNOSIS — H353190 Nonexudative age-related macular degeneration, unspecified eye, stage unspecified: Secondary | ICD-10-CM | POA: Diagnosis not present

## 2018-03-12 DIAGNOSIS — H35721 Serous detachment of retinal pigment epithelium, right eye: Secondary | ICD-10-CM | POA: Diagnosis not present

## 2018-03-16 DIAGNOSIS — H401111 Primary open-angle glaucoma, right eye, mild stage: Secondary | ICD-10-CM | POA: Diagnosis not present

## 2018-03-16 DIAGNOSIS — H35721 Serous detachment of retinal pigment epithelium, right eye: Secondary | ICD-10-CM | POA: Diagnosis not present

## 2018-03-16 DIAGNOSIS — H353211 Exudative age-related macular degeneration, right eye, with active choroidal neovascularization: Secondary | ICD-10-CM | POA: Diagnosis not present

## 2018-03-16 DIAGNOSIS — H353112 Nonexudative age-related macular degeneration, right eye, intermediate dry stage: Secondary | ICD-10-CM | POA: Diagnosis not present

## 2018-03-17 DIAGNOSIS — Z7901 Long term (current) use of anticoagulants: Secondary | ICD-10-CM | POA: Diagnosis not present

## 2018-03-18 DIAGNOSIS — Z85828 Personal history of other malignant neoplasm of skin: Secondary | ICD-10-CM | POA: Diagnosis not present

## 2018-03-18 DIAGNOSIS — L579 Skin changes due to chronic exposure to nonionizing radiation, unspecified: Secondary | ICD-10-CM | POA: Diagnosis not present

## 2018-03-18 DIAGNOSIS — L570 Actinic keratosis: Secondary | ICD-10-CM | POA: Diagnosis not present

## 2018-03-18 DIAGNOSIS — Z85820 Personal history of malignant melanoma of skin: Secondary | ICD-10-CM | POA: Diagnosis not present

## 2018-03-24 DIAGNOSIS — I824Y9 Acute embolism and thrombosis of unspecified deep veins of unspecified proximal lower extremity: Secondary | ICD-10-CM | POA: Diagnosis not present

## 2018-04-10 DIAGNOSIS — C641 Malignant neoplasm of right kidney, except renal pelvis: Secondary | ICD-10-CM | POA: Diagnosis not present

## 2018-04-10 DIAGNOSIS — S2243XA Multiple fractures of ribs, bilateral, initial encounter for closed fracture: Secondary | ICD-10-CM | POA: Diagnosis not present

## 2018-04-10 DIAGNOSIS — R109 Unspecified abdominal pain: Secondary | ICD-10-CM | POA: Diagnosis not present

## 2018-04-10 DIAGNOSIS — G8929 Other chronic pain: Secondary | ICD-10-CM | POA: Diagnosis not present

## 2018-04-13 DIAGNOSIS — H401113 Primary open-angle glaucoma, right eye, severe stage: Secondary | ICD-10-CM | POA: Diagnosis not present

## 2018-04-13 DIAGNOSIS — H4089 Other specified glaucoma: Secondary | ICD-10-CM | POA: Diagnosis not present

## 2018-04-22 DIAGNOSIS — I824Z9 Acute embolism and thrombosis of unspecified deep veins of unspecified distal lower extremity: Secondary | ICD-10-CM | POA: Diagnosis not present

## 2018-04-27 DIAGNOSIS — H353211 Exudative age-related macular degeneration, right eye, with active choroidal neovascularization: Secondary | ICD-10-CM | POA: Diagnosis not present

## 2018-04-27 DIAGNOSIS — H35721 Serous detachment of retinal pigment epithelium, right eye: Secondary | ICD-10-CM | POA: Diagnosis not present

## 2018-04-27 DIAGNOSIS — H401111 Primary open-angle glaucoma, right eye, mild stage: Secondary | ICD-10-CM | POA: Diagnosis not present

## 2018-04-27 DIAGNOSIS — H353112 Nonexudative age-related macular degeneration, right eye, intermediate dry stage: Secondary | ICD-10-CM | POA: Diagnosis not present

## 2018-05-06 DIAGNOSIS — I824Z1 Acute embolism and thrombosis of unspecified deep veins of right distal lower extremity: Secondary | ICD-10-CM | POA: Diagnosis not present

## 2018-05-18 DIAGNOSIS — M25461 Effusion, right knee: Secondary | ICD-10-CM | POA: Diagnosis not present

## 2018-05-18 DIAGNOSIS — S80211A Abrasion, right knee, initial encounter: Secondary | ICD-10-CM | POA: Diagnosis not present

## 2018-05-18 DIAGNOSIS — M25561 Pain in right knee: Secondary | ICD-10-CM | POA: Diagnosis not present

## 2018-05-19 ENCOUNTER — Telehealth: Payer: Self-pay | Admitting: *Deleted

## 2018-05-19 ENCOUNTER — Other Ambulatory Visit (HOSPITAL_COMMUNITY): Payer: Self-pay | Admitting: Interventional Radiology

## 2018-05-19 DIAGNOSIS — C641 Malignant neoplasm of right kidney, except renal pelvis: Secondary | ICD-10-CM

## 2018-05-19 NOTE — Telephone Encounter (Signed)
Pt fiance called and states Mr. Darryl Hall has moved to [place] living with her. It is too difficult to commute to appts in [place]. He is seeing physicians at [facility] and Dr. Radford Pax is following him as well. They will no longer be seeing Dr. Edison Nasuti

## 2018-05-29 DIAGNOSIS — Z23 Encounter for immunization: Secondary | ICD-10-CM | POA: Diagnosis not present

## 2018-05-29 DIAGNOSIS — Z0000 Encounter for general adult medical examination without abnormal findings: Secondary | ICD-10-CM | POA: Diagnosis not present

## 2018-05-29 DIAGNOSIS — I10 Essential (primary) hypertension: Secondary | ICD-10-CM | POA: Diagnosis not present

## 2018-05-29 DIAGNOSIS — C649 Malignant neoplasm of unspecified kidney, except renal pelvis: Secondary | ICD-10-CM | POA: Diagnosis not present

## 2018-06-05 DIAGNOSIS — I825Y9 Chronic embolism and thrombosis of unspecified deep veins of unspecified proximal lower extremity: Secondary | ICD-10-CM | POA: Diagnosis not present

## 2018-06-05 DIAGNOSIS — Z794 Long term (current) use of insulin: Secondary | ICD-10-CM | POA: Diagnosis not present

## 2018-06-10 DIAGNOSIS — H353124 Nonexudative age-related macular degeneration, left eye, advanced atrophic with subfoveal involvement: Secondary | ICD-10-CM | POA: Diagnosis not present

## 2018-06-10 DIAGNOSIS — H353112 Nonexudative age-related macular degeneration, right eye, intermediate dry stage: Secondary | ICD-10-CM | POA: Diagnosis not present

## 2018-06-10 DIAGNOSIS — H353211 Exudative age-related macular degeneration, right eye, with active choroidal neovascularization: Secondary | ICD-10-CM | POA: Diagnosis not present

## 2018-06-10 DIAGNOSIS — H401111 Primary open-angle glaucoma, right eye, mild stage: Secondary | ICD-10-CM | POA: Diagnosis not present

## 2018-06-24 DIAGNOSIS — C649 Malignant neoplasm of unspecified kidney, except renal pelvis: Secondary | ICD-10-CM | POA: Diagnosis not present

## 2018-06-26 IMAGING — CT CT HIP*R* W/O CM
2 of 3 series · 17 of 46 positions shown, 19 images · non-contrast
Comparison: Plain films right hip 04/05/2017.

CLINICAL DATA: Right hip pain since a fall onto the hip 2 weeks
ago. Subsequent encounter.

EXAM:
CT OF THE RIGHT HIP WITHOUT CONTRAST
TECHNIQUE: Multidetector CT imaging of the right hip was performed according to
the standard protocol. Multiplanar CT image reconstructions were
also generated.

[Series 3: pelvis 2.0 st · axial · 0.59mm/px · z∈[+791,+1051]mm · 14 of 150 slices shown, 16 images]
[im 10/150  soft-tissue]
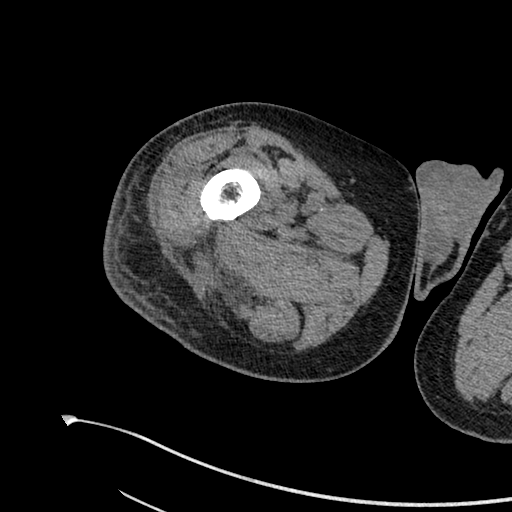
[im 10/150  bone]
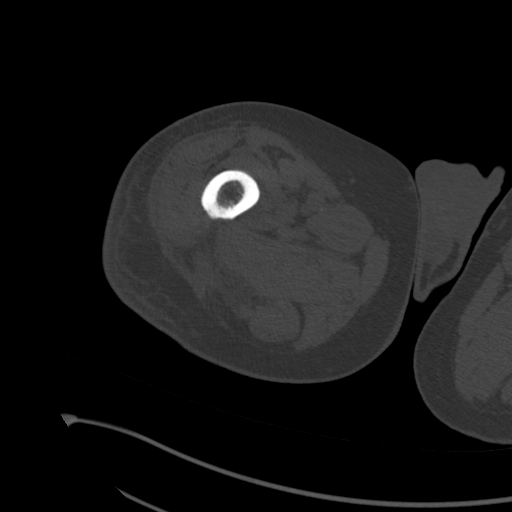
[im 20/150  soft-tissue]
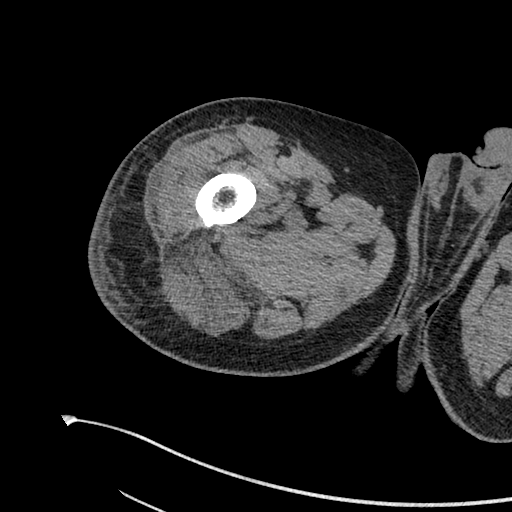
[im 29/150  soft-tissue]
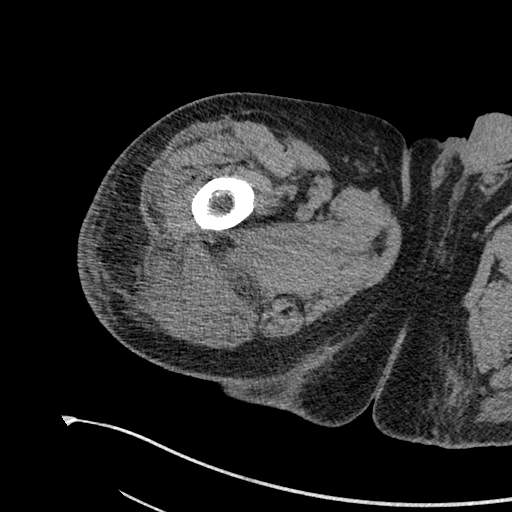
[im 39/150  soft-tissue]
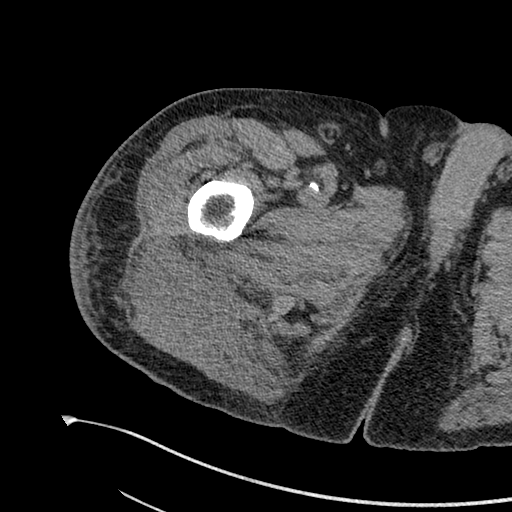
[im 49/150  soft-tissue]
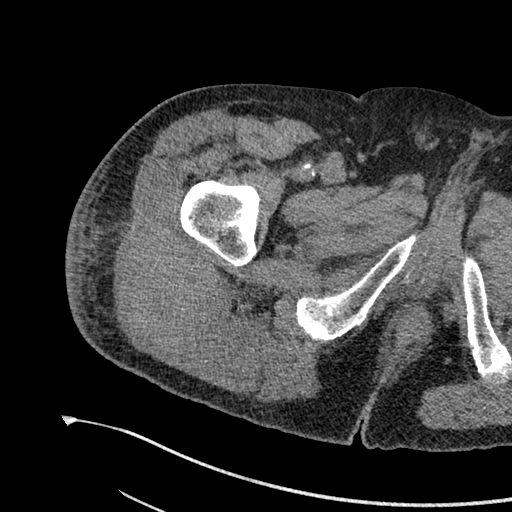
[im 58/150  soft-tissue]
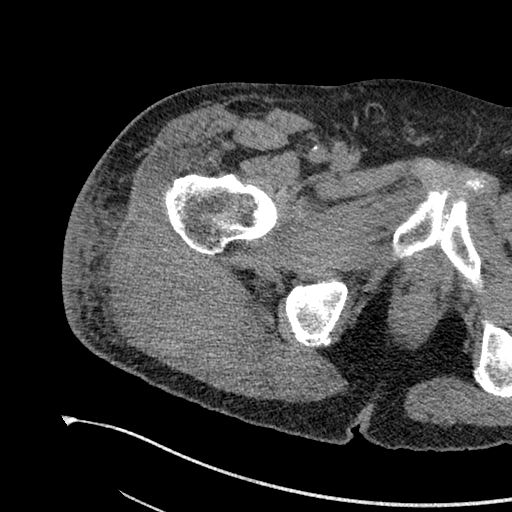
[im 68/150  soft-tissue]
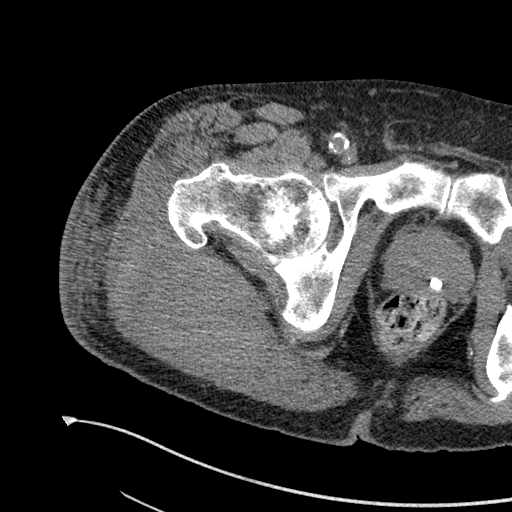
[im 82/150  soft-tissue]
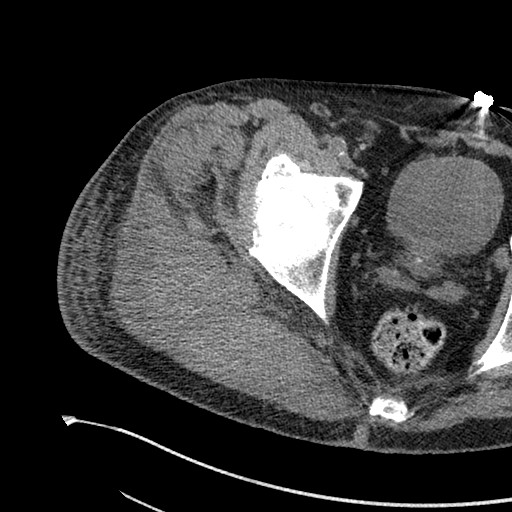
[im 92/150  soft-tissue]
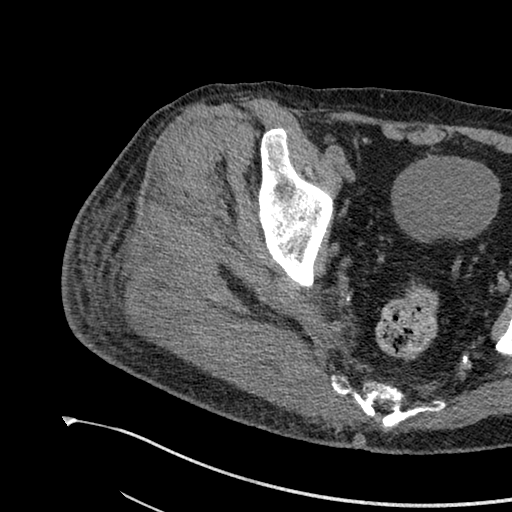
[im 92/150  bone]
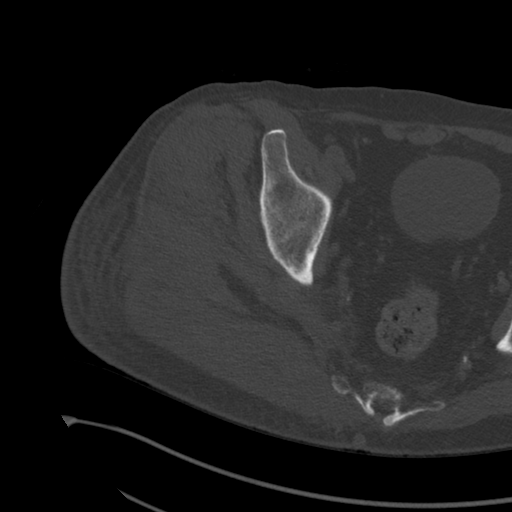
[im 101/150  soft-tissue]
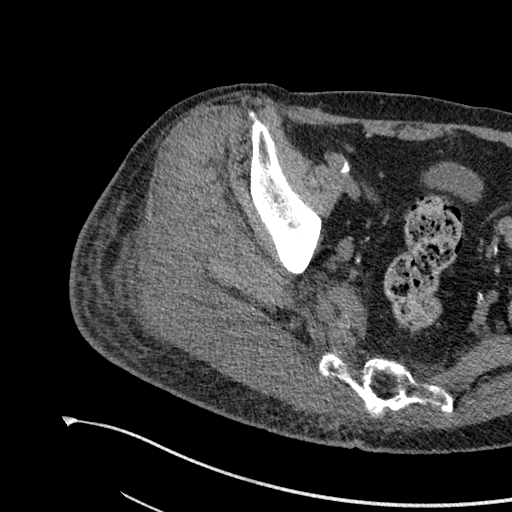
[im 111/150  soft-tissue]
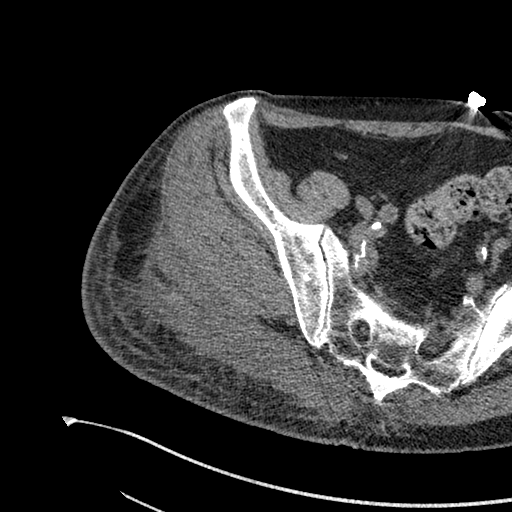
[im 121/150  soft-tissue]
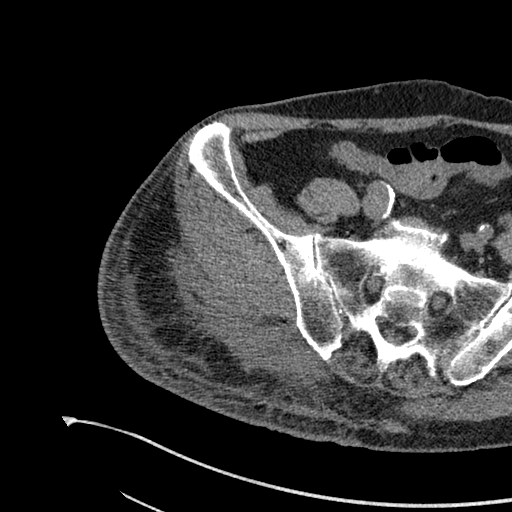
[im 130/150  soft-tissue]
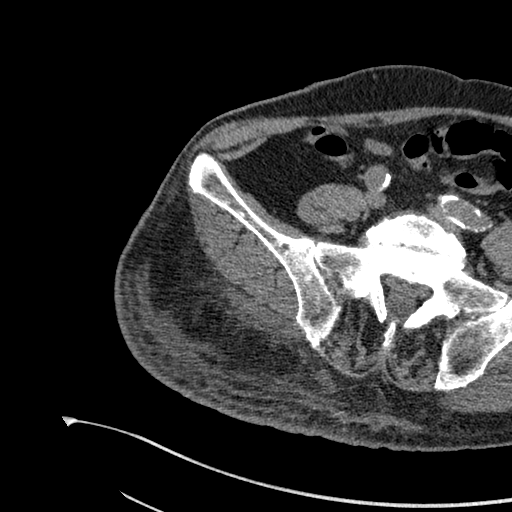
[im 140/150  soft-tissue]
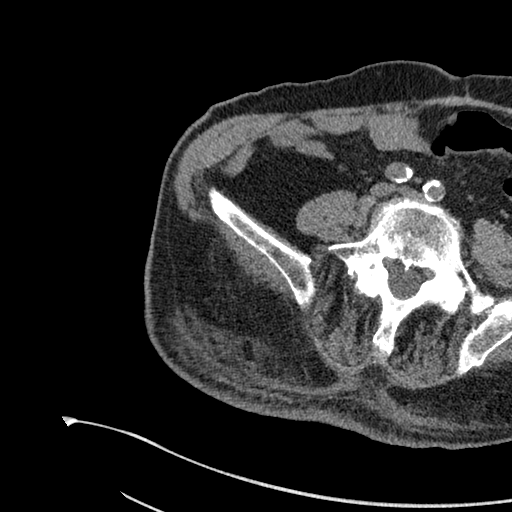

[Series 8: coronal st · coronal · 0.59mm/px · 3 of 125 slices shown]
[im 42/125  soft-tissue]
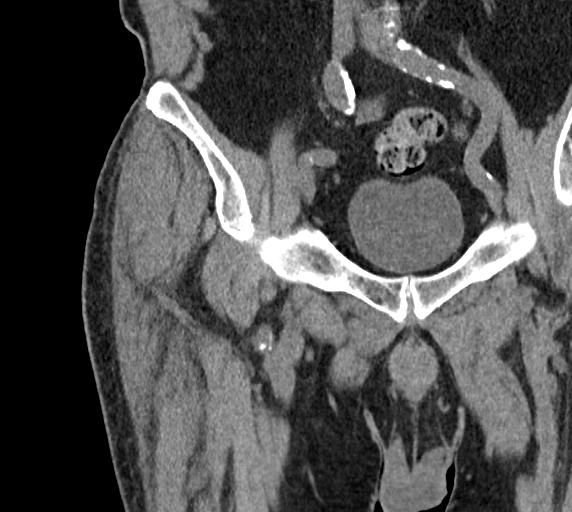
[im 56/125  soft-tissue]
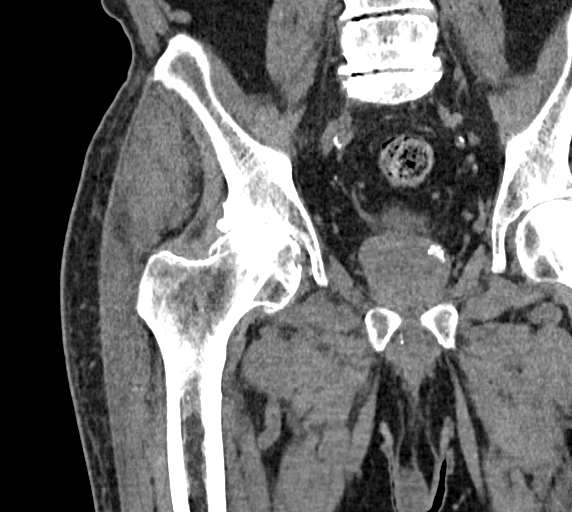
[im 69/125  soft-tissue]
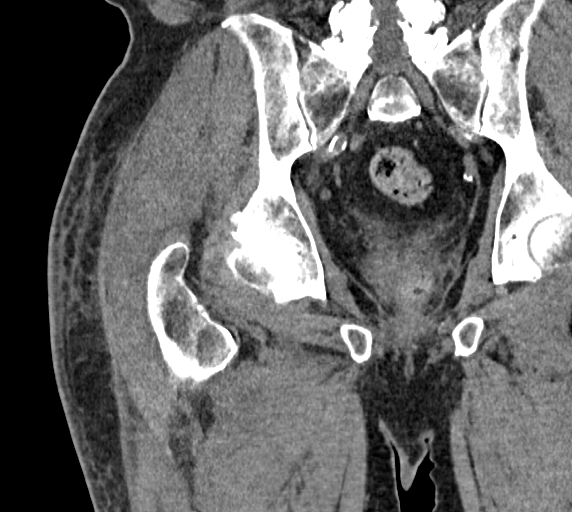

[17 of 46 positions shown; findings below may reference images not displayed]

FINDINGS: Bones/Joint/Cartilage

No acute bony or joint abnormality is identified. The patient has
advanced right hip osteoarthritis with bone-on-bone joint space
narrowing and osteophytosis present. Loss of disc space height and
vacuum disc phenomenon are noted at L4-5 and L5-S1. There is no
avascular necrosis of the femoral head or lytic lesion.

Ligaments

Suboptimally assessed by CT.

Muscles and Tendons

The right gluteus medius is enlarged with a rounded hyperattenuating
collection within it consistent with a hematoma measuring 8.8 cm
craniocaudal by 8.1 cm transverse by 6 cm AP. No muscle or tendon
tear is identified.

Soft tissues

Infiltration subcutaneous fat is seen about the right buttock.
Extensive atherosclerotic vascular disease identified. Prostate
calcifications are noted.
IMPRESSION: Large hematoma centered in the right gluteus medius.

Negative for acute bony abnormality.

Advanced right hip osteoarthritis.

Atherosclerosis.

## 2018-07-01 DIAGNOSIS — C649 Malignant neoplasm of unspecified kidney, except renal pelvis: Secondary | ICD-10-CM | POA: Diagnosis not present

## 2018-07-07 DIAGNOSIS — I824Y9 Acute embolism and thrombosis of unspecified deep veins of unspecified proximal lower extremity: Secondary | ICD-10-CM | POA: Diagnosis not present

## 2018-07-14 DIAGNOSIS — Z7901 Long term (current) use of anticoagulants: Secondary | ICD-10-CM | POA: Diagnosis not present

## 2018-07-21 DIAGNOSIS — Z23 Encounter for immunization: Secondary | ICD-10-CM | POA: Diagnosis not present

## 2018-07-22 DIAGNOSIS — H353112 Nonexudative age-related macular degeneration, right eye, intermediate dry stage: Secondary | ICD-10-CM | POA: Diagnosis not present

## 2018-07-22 DIAGNOSIS — H401111 Primary open-angle glaucoma, right eye, mild stage: Secondary | ICD-10-CM | POA: Diagnosis not present

## 2018-07-22 DIAGNOSIS — H35721 Serous detachment of retinal pigment epithelium, right eye: Secondary | ICD-10-CM | POA: Diagnosis not present

## 2018-07-22 DIAGNOSIS — H353211 Exudative age-related macular degeneration, right eye, with active choroidal neovascularization: Secondary | ICD-10-CM | POA: Diagnosis not present

## 2018-07-28 DIAGNOSIS — I8290 Acute embolism and thrombosis of unspecified vein: Secondary | ICD-10-CM | POA: Diagnosis not present

## 2018-08-26 DIAGNOSIS — H4051X3 Glaucoma secondary to other eye disorders, right eye, severe stage: Secondary | ICD-10-CM | POA: Diagnosis not present

## 2018-08-26 DIAGNOSIS — H401111 Primary open-angle glaucoma, right eye, mild stage: Secondary | ICD-10-CM | POA: Diagnosis not present

## 2018-08-26 DIAGNOSIS — H35721 Serous detachment of retinal pigment epithelium, right eye: Secondary | ICD-10-CM | POA: Diagnosis not present

## 2018-08-26 DIAGNOSIS — H353211 Exudative age-related macular degeneration, right eye, with active choroidal neovascularization: Secondary | ICD-10-CM | POA: Diagnosis not present

## 2018-09-01 DIAGNOSIS — Z7901 Long term (current) use of anticoagulants: Secondary | ICD-10-CM | POA: Diagnosis not present

## 2018-09-15 DIAGNOSIS — I824Y9 Acute embolism and thrombosis of unspecified deep veins of unspecified proximal lower extremity: Secondary | ICD-10-CM | POA: Diagnosis not present

## 2018-09-15 DIAGNOSIS — Z7901 Long term (current) use of anticoagulants: Secondary | ICD-10-CM | POA: Diagnosis not present

## 2018-09-23 DIAGNOSIS — L570 Actinic keratosis: Secondary | ICD-10-CM | POA: Diagnosis not present

## 2018-09-23 DIAGNOSIS — L579 Skin changes due to chronic exposure to nonionizing radiation, unspecified: Secondary | ICD-10-CM | POA: Diagnosis not present

## 2018-10-08 DIAGNOSIS — H35721 Serous detachment of retinal pigment epithelium, right eye: Secondary | ICD-10-CM | POA: Diagnosis not present

## 2018-10-08 DIAGNOSIS — H353222 Exudative age-related macular degeneration, left eye, with inactive choroidal neovascularization: Secondary | ICD-10-CM | POA: Diagnosis not present

## 2018-10-08 DIAGNOSIS — H353211 Exudative age-related macular degeneration, right eye, with active choroidal neovascularization: Secondary | ICD-10-CM | POA: Diagnosis not present

## 2018-10-16 DIAGNOSIS — I8290 Acute embolism and thrombosis of unspecified vein: Secondary | ICD-10-CM | POA: Diagnosis not present

## 2018-10-30 DIAGNOSIS — Z7901 Long term (current) use of anticoagulants: Secondary | ICD-10-CM | POA: Diagnosis not present

## 2018-11-16 DIAGNOSIS — H35721 Serous detachment of retinal pigment epithelium, right eye: Secondary | ICD-10-CM | POA: Diagnosis not present

## 2018-11-16 DIAGNOSIS — H353211 Exudative age-related macular degeneration, right eye, with active choroidal neovascularization: Secondary | ICD-10-CM | POA: Diagnosis not present

## 2018-11-16 DIAGNOSIS — H353112 Nonexudative age-related macular degeneration, right eye, intermediate dry stage: Secondary | ICD-10-CM | POA: Diagnosis not present

## 2018-11-16 DIAGNOSIS — H353222 Exudative age-related macular degeneration, left eye, with inactive choroidal neovascularization: Secondary | ICD-10-CM | POA: Diagnosis not present

## 2018-11-17 DIAGNOSIS — Z7901 Long term (current) use of anticoagulants: Secondary | ICD-10-CM | POA: Diagnosis not present

## 2018-11-30 DIAGNOSIS — Z0000 Encounter for general adult medical examination without abnormal findings: Secondary | ICD-10-CM | POA: Diagnosis not present

## 2020-01-27 ENCOUNTER — Encounter (INDEPENDENT_AMBULATORY_CARE_PROVIDER_SITE_OTHER): Payer: Self-pay | Admitting: Ophthalmology

## 2020-01-27 ENCOUNTER — Other Ambulatory Visit: Payer: Self-pay

## 2020-01-27 ENCOUNTER — Ambulatory Visit (INDEPENDENT_AMBULATORY_CARE_PROVIDER_SITE_OTHER): Payer: Medicare Other | Admitting: Ophthalmology

## 2020-01-27 DIAGNOSIS — H353222 Exudative age-related macular degeneration, left eye, with inactive choroidal neovascularization: Secondary | ICD-10-CM | POA: Diagnosis not present

## 2020-01-27 DIAGNOSIS — H35721 Serous detachment of retinal pigment epithelium, right eye: Secondary | ICD-10-CM

## 2020-01-27 DIAGNOSIS — H353211 Exudative age-related macular degeneration, right eye, with active choroidal neovascularization: Secondary | ICD-10-CM | POA: Diagnosis not present

## 2020-01-27 DIAGNOSIS — H4051X2 Glaucoma secondary to other eye disorders, right eye, moderate stage: Secondary | ICD-10-CM | POA: Insufficient documentation

## 2020-01-27 DIAGNOSIS — H4051X3 Glaucoma secondary to other eye disorders, right eye, severe stage: Secondary | ICD-10-CM

## 2020-01-27 MED ORDER — AFLIBERCEPT 2MG/0.05ML IZ SOLN FOR KALEIDOSCOPE
2.0000 mg | INTRAVITREAL | Status: AC | PRN
  Administered 2020-01-27: 2 mg via INTRAVITREAL

## 2020-01-27 NOTE — Progress Notes (Signed)
01/27/2020     CHIEF COMPLAINT Patient presents for Retina Follow Up   HISTORY OF PRESENT ILLNESS: Darryl Hall is a 86 y.o. male who presents to the clinic today for:   HPI    Retina Follow Up    Patient presents with  Wet AMD.  In right eye.  This started 8 weeks ago.  Severity is mild.  Duration of 8 weeks.  Since onset it is stable.          Comments    8 Week AMD F/U OS, poss Eylea OD  Pt denies noticeable changes to [place] OU since last visit. Pt denies ocular pain, flashes of light, or floaters OU.         Last edited by Rockie Neighbours, [place] on 01/27/2020 10:15 AM. (History)      Referring physician: Merrilee Seashore, MD [place],   [id number]  HISTORICAL INFORMATION:   Selected notes from the MEDICAL RECORD NUMBER       CURRENT MEDICATIONS: Current Outpatient Medications (Ophthalmic Drugs)  Medication Sig  . brimonidine-timolol (COMBIGAN) 0.2-0.5 % ophthalmic solution Place 1 drop into the right eye every 12 (twelve) hours.  . dorzolamide (TRUSOPT) 2 % ophthalmic solution Place 1 drop into the right eye 2 (two) times daily.  Marland Kitchen latanoprost (XALATAN) 0.005 % ophthalmic solution Place 1 drop into the right eye at bedtime.   No current facility-administered medications for this visit. (Ophthalmic Drugs)   Current Outpatient Medications (Other)  Medication Sig  . acetaminophen (TYLENOL) 325 MG tablet Take 650 mg by mouth every 6 (six) hours as needed for mild pain.  . Ascorbic Acid (VITAMIN C) 100 MG tablet Take 100 mg by mouth daily.  Marland Kitchen gabapentin (NEURONTIN) 100 MG capsule Take 200 mg by mouth 2 (two) times daily.  Marland Kitchen HYDROcodone-acetaminophen (NORCO/VICODIN) 5-325 MG tablet Take 2 tablets by mouth every 4 (four) hours as needed.  Marland Kitchen lisinopril (PRINIVIL,ZESTRIL) 20 MG tablet Take 1 tablet (20 mg total) by mouth daily. PLEASE CONTACT OFFICE FOR ADDITIONAL REFILLS 2ND ATTEMPT  . lovastatin (MEVACOR) 20 MG tablet Take 40 mg by  mouth daily.   . Multiple Vitamins-Minerals (PRESERVISION AREDS 2 PO) Take 1 tablet by mouth 2 (two) times daily.   . Omega-3 Fatty Acids (FISH OIL) 500 MG CAPS Take 1 capsule by mouth daily.  . traMADol (ULTRAM) 50 MG tablet Take 50 mg by mouth 2 (two) times daily.  Marland Kitchen warfarin (COUMADIN) 3 MG tablet Take 3-4.5 mg by mouth daily. Takes 3 mg on Sunday, Monday, Wednesday, and Friday. Takes 4.5mg  on Tuesday, Thursday and Saturday   No current facility-administered medications for this visit. (Other)      REVIEW OF SYSTEMS:    ALLERGIES No Known Allergies  PAST MEDICAL HISTORY Past Medical History:  Diagnosis Date  . Anginal pain ([place])   . Arthritis   . CAD (coronary artery disease)   . colon ca dx'd 1998 & 2002   colon resection x 2   . Colon polyp   . Dyslipidemia   . Dysrhythmia    Paroxysmal atrial fibrillation    . Gum disease    or dentures  . Hernia   . History of DVT of lower extremity   . PAF (paroxysmal atrial fibrillation) ([place])    H/O  . Sinus node dysfunction (HCC)   . Systemic hypertension    Past Surgical History:  Procedure Laterality Date  . APPENDECTOMY    . cardiac bypass  07/12/2002   LIMA to LAD,SVG to first diagonal,SVG to obtuse marginal one and two, SVG to conus branch & posterior descending.  Marland Kitchen CARDIAC CATHETERIZATION  07/08/2002   three vessel  CAD  . COLON SURGERY     cancer  . HERNIA REPAIR  2012  . IR GENERIC HISTORICAL  06/06/2016   IR RADIOLOGIST EVAL & MGMT 06/06/2016 Aletta Edouard, MD [facility] INTERV RAD  . IR RADIOLOGIST EVAL & MGMT  07/02/2017  . VENTRAL HERNIA REPAIR  2012    FAMILY HISTORY Family History  Problem Relation Age of Onset  . Heart disease Father   . Cancer Brother        pt unaware of what kind    SOCIAL HISTORY Social History   Tobacco Use  . Smoking status: Former Research scientist (life sciences)  . Smokeless tobacco: Never Used  Substance Use Topics  . Alcohol use: No  . Drug use: No         OPHTHALMIC EXAM:  Base Eye  Exam    Visual Acuity (Snellen - Linear)      Right Left   Dist [place] 20/40 +1 CF @ 1'   Dist ph [place] 20/30 +1 NI       Tonometry (Tonopen, 10:21 AM)      Right Left   Pressure 11 17       Pupils      Dark Light Shape React APD   Right 3 2 Round Sluggish None   Left 7 7 Irregular None None       Visual Fields (Counting fingers)      Left Right   Restrictions Total superior temporal, inferior temporal, superior nasal deficiencies        Extraocular Movement      Right Left    Full Full       Neuro/Psych    Oriented x3: Yes   Mood/Affect: Normal       Dilation    Right eye: 1.0% Mydriacyl, 2.5% Phenylephrine @ 10:21 AM        Slit Lamp and Fundus Exam    External Exam      Right Left   External Normal Normal       Slit Lamp Exam      Right Left   Lids/Lashes Normal Normal   Conjunctiva/Sclera White and quiet White and quiet   Cornea Clear Clear   Anterior Chamber Deep and quiet Deep and quiet   Iris Round and reactive Round and reactive   Lens Posterior chamber intraocular lens Posterior chamber intraocular lens   Anterior Vitreous Normal Normal       Fundus Exam      Right Left   Posterior Vitreous Posterior vitreous detachment Posterior vitreous detachment   Disc 1+ Pallor 1+ Pallor   C/D Ratio 0.7 0.8   Macula Retinal pigment epithelial atrophy, Age related macular degeneration, Advanced age related macular degeneration, no hemorrhage, no macular thickening, Subretinal neovascular membrane, Retinal pigment epithelial mottling Retinal pigment epithelial atrophy, Age related macular degeneration, Advanced age related macular degeneration, no hemorrhage, no macular thickening, Subretinal neovascular membrane resolved , Retinal pigment epithelial mottling   Vessels Normal Normal   Periphery Normal Normal          IMAGING AND PROCEDURES  Imaging and Procedures for @TODAY @  OCT, Retina - OU - Both Eyes       Right Eye Quality was good. Scan locations  included subfoveal. Central Foveal Thickness: 254. Progression has improved. Findings include no  IRF, no SRF, outer retinal atrophy, subretinal scarring, subretinal fluid.   Left Eye Quality was good. Scan locations included subfoveal. Findings include abnormal foveal contour, disciform scar, no IRF, no SRF, outer retinal atrophy.   Notes OD, improved and stable on Intravitreal Eylea, with minor subretinal fluid, stable at 8 weeks and good vision.  OS with old disciform scar       Intravitreal Injection, Pharmacologic Agent - OD - Right Eye       Time Out 01/27/2020. 11:34 AM. Confirmed correct patient, procedure, site, and patient consented.   Anesthesia Topical anesthesia was used. Anesthetic medications included [place] 3.5%.   Procedure Preparation included Ofloxacin , 10% betadine to eyelids. A 30 gauge needle was used.   Injection:  2 mg aflibercept Alfonse Flavors) SOLN   NDC: [id number], Lot: [id number]   Route: Intravitreal, Site: Right Eye, Waste: 0 mg  Post-op Post injection exam found visual acuity of at least counting fingers. The patient tolerated the procedure well. The patient received written and verbal post procedure care education. Post injection medications were not given.                 ASSESSMENT/PLAN:  Exudative age-related macular degeneration of right eye with active choroidal neovascularization (HCC) The nature of wet macular degeneration was discussed with the patient.  Forms of therapy reviewed include the use of Anti-VEGF medications injected painlessly into the eye, as well as other possible treatment modalities, including thermal laser therapy. Fellow eye involvement and risks were discussed with the patient. Upon the finding of wet age related macular degeneration, treatment will be offered. The treatment regimen is on a treat as needed basis with the intent to treat if necessary and extend interval of exams when possible. On average 1 out of 6  patients do not need lifetime therapy. However, the risk of recurrent disease is high for a lifetime.  Initially monthly, then periodic, examinations and evaluations will determine whether the next treatment is required on the day of the examination.  OD chronically active CNVM with serous subretinal fluid, stable alignment and improved on Eylea, every 8 weeks currently      ICD-10-CM   1. Exudative age-related macular degeneration of right eye with active choroidal neovascularization (HCC)  H35.3211 OCT, Retina - OU - Both Eyes    Intravitreal Injection, Pharmacologic Agent - OD - Right Eye    aflibercept (EYLEA) SOLN 2 mg  2. Serous detachment of retinal pigment epithelium of right eye  H35.721 Intravitreal Injection, Pharmacologic Agent - OD - Right Eye    aflibercept (EYLEA) SOLN 2 mg  3. Neovascular glaucoma, right eye, severe stage  H40.51X3   4. Exudative age-related macular degeneration of left eye with inactive choroidal neovascularization ([place])  H35.3222     1.  Repeat Eylea intravitreal injection OD today and  2.  Examination OD in 8 weeks possible Eylea  3.  Ophthalmic Meds Ordered this visit:  Meds ordered this encounter  Medications  . aflibercept (EYLEA) SOLN 2 mg       Return in about 8 weeks (around 03/23/2020) for OD, dilate, EYLEA OCT.  There are no Patient Instructions on file for this visit.   Explained the diagnoses, plan, and follow up with the patient and they expressed understanding.  Patient expressed understanding of the importance of proper follow up care.   Clent Demark Leler Brion M.D. Diseases & Surgery of the Retina and Vitreous Retina & Diabetic [place] @TODAY @     Abbreviations: Jerilynn Mages  myopia (nearsighted); A astigmatism; H hyperopia (farsighted); P presbyopia; Mrx spectacle prescription;  CTL contact lenses; OD right eye; OS left eye; OU both eyes  XT exotropia; ET esotropia; PEK punctate epithelial keratitis; PEE punctate epithelial erosions; DES  dry eye syndrome; MGD meibomian gland dysfunction; ATs artificial tears; PFAT's preservative free artificial tears; [place] nuclear sclerotic cataract; PSC posterior subcapsular cataract; ERM epi-retinal membrane; PVD posterior vitreous detachment; RD retinal detachment; DM diabetes mellitus; DR diabetic retinopathy; NPDR non-proliferative diabetic retinopathy; PDR proliferative diabetic retinopathy; CSME clinically significant macular edema; DME diabetic macular edema; dbh dot blot hemorrhages; CWS cotton wool spot; POAG primary open angle glaucoma; C/D cup-to-disc ratio; HVF humphrey visual field; GVF goldmann visual field; OCT optical coherence tomography; IOP intraocular pressure; BRVO Branch retinal vein occlusion; CRVO central retinal vein occlusion; CRAO central retinal artery occlusion; BRAO branch retinal artery occlusion; RT retinal tear; SB scleral buckle; PPV pars plana vitrectomy; VH Vitreous hemorrhage; PRP panretinal laser photocoagulation; IVK intravitreal kenalog; VMT vitreomacular traction; MH Macular hole;  NVD neovascularization of the disc; NVE neovascularization elsewhere; AREDS age related eye disease study; ARMD age related macular degeneration; POAG primary open angle glaucoma; EBMD epithelial/anterior basement membrane dystrophy; ACIOL anterior chamber intraocular lens; IOL intraocular lens; PCIOL posterior chamber intraocular lens; Phaco/IOL phacoemulsification with intraocular lens placement; [place] photorefractive keratectomy; LASIK laser assisted in situ keratomileusis; HTN hypertension; DM diabetes mellitus; COPD chronic obstructive pulmonary disease

## 2020-01-27 NOTE — Assessment & Plan Note (Signed)
The nature of wet macular degeneration was discussed with the patient.  Forms of therapy reviewed include the use of Anti-VEGF medications injected painlessly into the eye, as well as other possible treatment modalities, including thermal laser therapy. Fellow eye involvement and risks were discussed with the patient. Upon the finding of wet age related macular degeneration, treatment will be offered. The treatment regimen is on a treat as needed basis with the intent to treat if necessary and extend interval of exams when possible. On average 1 out of 6 patients do not need lifetime therapy. However, the risk of recurrent disease is high for a lifetime.  Initially monthly, then periodic, examinations and evaluations will determine whether the next treatment is required on the day of the examination.  OD chronically active CNVM with serous subretinal fluid, stable alignment and improved on Eylea, every 8 weeks currently

## 2020-03-30 ENCOUNTER — Other Ambulatory Visit: Payer: Self-pay

## 2020-03-30 ENCOUNTER — Encounter (INDEPENDENT_AMBULATORY_CARE_PROVIDER_SITE_OTHER): Payer: Self-pay | Admitting: Ophthalmology

## 2020-03-30 ENCOUNTER — Ambulatory Visit (INDEPENDENT_AMBULATORY_CARE_PROVIDER_SITE_OTHER): Payer: Medicare Other | Admitting: Ophthalmology

## 2020-03-30 DIAGNOSIS — H353211 Exudative age-related macular degeneration, right eye, with active choroidal neovascularization: Secondary | ICD-10-CM

## 2020-03-30 MED ORDER — AFLIBERCEPT 2MG/0.05ML IZ SOLN FOR KALEIDOSCOPE
2.0000 mg | INTRAVITREAL | Status: AC | PRN
  Administered 2020-03-30: 2 mg via INTRAVITREAL

## 2020-03-30 NOTE — Assessment & Plan Note (Signed)
The nature of wet macular degeneration was discussed with the patient.  Forms of therapy reviewed include the use of Anti-VEGF medications injected painlessly into the eye, as well as other possible treatment modalities, including thermal laser therapy. Fellow eye involvement and risks were discussed with the patient. Upon the finding of wet age related macular degeneration, treatment will be offered. The treatment regimen is on a treat as needed basis with the intent to treat if necessary and extend interval of exams when possible. On average 1 out of 6 patients do not need lifetime therapy. However, the risk of recurrent disease is high for a lifetime.  Initially monthly, then periodic, examinations and evaluations will determine whether the next treatment is required on the day of the examination.  OD: Recurrent subretinal fluid at 8-week examination interval post intravitreal Eylea will repeat injection OD today and examination 6 weeks

## 2020-03-30 NOTE — Progress Notes (Signed)
03/30/2020     CHIEF COMPLAINT Patient presents for Retina Follow Up   HISTORY OF PRESENT ILLNESS: Darryl Hall is a 86 y.o. male who presents to the clinic today for:   HPI    Retina Follow Up    Patient presents with  Wet AMD.  In right eye.  Duration of 9 weeks.  Since onset it is stable.          Comments    9 week follow up - OCT OU, Poss Eylea OD Patient denies change in vision and overall has no complaints.        Last edited by Gerda Diss on 03/30/2020 10:48 AM. (History)      Referring physician: Merrilee Seashore, [address] [place],  [place] [address]  HISTORICAL INFORMATION:   Selected notes from the MEDICAL RECORD NUMBER       CURRENT MEDICATIONS: Current Outpatient Medications (Ophthalmic Drugs)  Medication Sig  . brimonidine-timolol (COMBIGAN) 0.2-0.5 % ophthalmic solution Place 1 drop into the right eye every 12 (twelve) hours.  . dorzolamide (TRUSOPT) 2 % ophthalmic solution Place 1 drop into the right eye 2 (two) times daily.  Marland Kitchen latanoprost (XALATAN) 0.005 % ophthalmic solution Place 1 drop into the right eye at bedtime.   No current facility-administered medications for this visit. (Ophthalmic Drugs)   Current Outpatient Medications (Other)  Medication Sig  . acetaminophen (TYLENOL) 325 MG tablet Take 650 mg by mouth every 6 (six) hours as needed for mild pain.  . Ascorbic Acid (VITAMIN C) 100 MG tablet Take 100 mg by mouth daily.  Marland Kitchen gabapentin (NEURONTIN) 100 MG capsule Take 200 mg by mouth 2 (two) times daily.  Marland Kitchen HYDROcodone-acetaminophen (NORCO/VICODIN) 5-325 MG tablet Take 2 tablets by mouth every 4 (four) hours as needed.  Marland Kitchen lisinopril (PRINIVIL,ZESTRIL) 20 MG tablet Take 1 tablet (20 mg total) by mouth daily. PLEASE CONTACT OFFICE FOR ADDITIONAL REFILLS 2ND ATTEMPT  . lovastatin (MEVACOR) 20 MG tablet Take 40 mg by mouth daily.   . Multiple Vitamins-Minerals (PRESERVISION AREDS 2 PO) Take 1 tablet by mouth 2  (two) times daily.   . Omega-3 Fatty Acids (FISH OIL) 500 MG CAPS Take 1 capsule by mouth daily.  . traMADol (ULTRAM) 50 MG tablet Take 50 mg by mouth 2 (two) times daily.  Marland Kitchen warfarin (COUMADIN) 3 MG tablet Take 3-4.5 mg by mouth daily. Takes 3 mg on Sunday, Monday, Wednesday, and Friday. Takes 4.5mg  on Tuesday, Thursday and Saturday   No current facility-administered medications for this visit. (Other)      REVIEW OF SYSTEMS:    ALLERGIES No Known Allergies  PAST MEDICAL HISTORY Past Medical History:  Diagnosis Date  . Anginal pain ([employer])   . Arthritis   . CAD (coronary artery disease)   . colon ca dx'd 1998 & 2002   colon resection x 2   . Colon polyp   . Dyslipidemia   . Dysrhythmia    Paroxysmal atrial fibrillation    . Gum disease    or dentures  . Hernia   . History of DVT of lower extremity   . PAF (paroxysmal atrial fibrillation) ([place])    H/O  . Sinus node dysfunction (HCC)   . Systemic hypertension    Past Surgical History:  Procedure Laterality Date  . APPENDECTOMY    . cardiac bypass  07/12/2002   LIMA to LAD,SVG to first diagonal,SVG to obtuse marginal one and two, SVG to conus branch & posterior  descending.  Marland Kitchen CARDIAC CATHETERIZATION  07/08/2002   three vessel  CAD  . COLON SURGERY     cancer  . HERNIA REPAIR  2012  . IR GENERIC HISTORICAL  06/06/2016   IR RADIOLOGIST EVAL & MGMT 06/06/2016 Aletta Edouard, MD [facility] INTERV RAD  . IR RADIOLOGIST EVAL & MGMT  07/02/2017  . VENTRAL HERNIA REPAIR  2012    FAMILY HISTORY Family History  Problem Relation Age of Onset  . Heart disease Father   . Cancer Brother        pt unaware of what kind    SOCIAL HISTORY Social History   Tobacco Use  . Smoking status: Former Research scientist (life sciences)  . Smokeless tobacco: Never Used  Substance Use Topics  . Alcohol use: No  . Drug use: No         OPHTHALMIC EXAM:  Base Eye Exam    Visual Acuity (Snellen - Linear)      Right Left   Dist [place] 20/40-1 CF @ 1'   Dist ph [place]  20/30-2 NI       Tonometry (Tonopen, 10:57 AM)      Right Left   Pressure 10 18       Pupils      Pupils Dark Light Shape React APD   Right PERRL 3 2 Round Sluggish None   Left PERRL 7 7 Irregular Minimal None       Visual Fields (Counting fingers)      Left Right     Full   Restrictions Partial outer inferior nasal deficiency        Extraocular Movement      Right Left    Full Full       Neuro/Psych    Oriented x3: Yes   Mood/Affect: Normal       Dilation    Right eye: 1.0% Mydriacyl, 2.5% Phenylephrine @ 10:57 AM        Slit Lamp and Fundus Exam    External Exam      Right Left   External Normal Normal       Slit Lamp Exam      Right Left   Lids/Lashes Normal Normal   Conjunctiva/Sclera White and quiet White and quiet   Cornea Clear Clear   Anterior Chamber Deep and quiet Deep and quiet   Iris Round and reactive Round and reactive   Lens Posterior chamber intraocular lens Posterior chamber intraocular lens   Anterior Vitreous Normal Normal       Fundus Exam      Right Left   Posterior Vitreous Posterior vitreous detachment    Disc 1+ Pallor    C/D Ratio 0.7    Macula Retinal pigment epithelial atrophy, Age related macular degeneration, Advanced age related macular degeneration, no hemorrhage, macular thickening, Subretinal neovascular membrane, Retinal pigment epithelial mottling    Vessels Normal    Periphery Normal           IMAGING AND PROCEDURES  Imaging and Procedures for 03/30/20  OCT, Retina - OU - Both Eyes       Right Eye Quality was good. Scan locations included subfoveal. Central Foveal Thickness: 359. Findings include abnormal foveal contour, subretinal fluid, subretinal scarring, choroidal neovascular membrane, subretinal hyper-reflective material, pigment epithelial detachment.   Left Eye Quality was good. Scan locations included subfoveal. Central Foveal Thickness: 191. Findings include outer retinal atrophy, central retinal  atrophy, inner retinal atrophy.   Notes OD with new recurrent subretinal fluid at  the 8-week interval examination.  Will repeat intravitreal Eylea and examination in 6 weeks       Intravitreal Injection, Pharmacologic Agent - OD - Right Eye       Time Out 03/30/2020. 11:45 AM. Confirmed correct patient, procedure, site, and patient consented.   Anesthesia Topical anesthesia was used. Anesthetic medications included Akten 3.5%.   Procedure Preparation included Ofloxacin , 10% betadine to eyelids. A 30 gauge needle was used.   Injection:  2 mg aflibercept Alfonse Flavors) SOLN   NDC: [id number], Lot: [id number]   Route: Intravitreal, Site: Right Eye, Waste: 0 mg  Post-op Post injection exam found visual acuity of at least counting fingers. The patient tolerated the procedure well. There were no complications. The patient received written and verbal post procedure care education. Post injection medications were not given.                 ASSESSMENT/PLAN:  Exudative age-related macular degeneration of right eye with active choroidal neovascularization (HCC) The nature of wet macular degeneration was discussed with the patient.  Forms of therapy reviewed include the use of Anti-VEGF medications injected painlessly into the eye, as well as other possible treatment modalities, including thermal laser therapy. Fellow eye involvement and risks were discussed with the patient. Upon the finding of wet age related macular degeneration, treatment will be offered. The treatment regimen is on a treat as needed basis with the intent to treat if necessary and extend interval of exams when possible. On average 1 out of 6 patients do not need lifetime therapy. However, the risk of recurrent disease is high for a lifetime.  Initially monthly, then periodic, examinations and evaluations will determine whether the next treatment is required on the day of the examination.  OD: Recurrent subretinal fluid at  8-week examination interval post intravitreal Eylea will repeat injection OD today and examination 6 weeks      ICD-10-CM   1. Exudative age-related macular degeneration of right eye with active choroidal neovascularization (HCC)  H35.3211 OCT, Retina - OU - Both Eyes    Intravitreal Injection, Pharmacologic Agent - OD - Right Eye    aflibercept (EYLEA) SOLN 2 mg    1.  2.  3.  Ophthalmic Meds Ordered this visit:  Meds ordered this encounter  Medications  . aflibercept (EYLEA) SOLN 2 mg       Return in about 6 weeks (around 05/11/2020) for OD, dilate, EYLEA OCT.  There are no Patient Instructions on file for this visit.   Explained the diagnoses, plan, and follow up with the patient and they expressed understanding.  Patient expressed understanding of the importance of proper follow up care.   Clent Demark Juvenal Umar M.D. Diseases & Surgery of the Retina and Vitreous Retina & Diabetic [place] 03/30/20     Abbreviations: M myopia (nearsighted); A astigmatism; H hyperopia (farsighted); P presbyopia; Mrx spectacle prescription;  CTL contact lenses; OD right eye; OS left eye; OU both eyes  XT exotropia; ET esotropia; PEK punctate epithelial keratitis; PEE punctate epithelial erosions; DES dry eye syndrome; MGD meibomian gland dysfunction; ATs artificial tears; PFAT's preservative free artificial tears; [place] nuclear sclerotic cataract; PSC posterior subcapsular cataract; ERM epi-retinal membrane; PVD posterior vitreous detachment; RD retinal detachment; DM diabetes mellitus; DR diabetic retinopathy; NPDR non-proliferative diabetic retinopathy; PDR proliferative diabetic retinopathy; CSME clinically significant macular edema; DME diabetic macular edema; dbh dot blot hemorrhages; CWS cotton wool spot; POAG primary open angle glaucoma; C/D cup-to-disc ratio; HVF humphrey visual field; GVF  goldmann visual field; OCT optical coherence tomography; IOP intraocular pressure; BRVO Branch retinal vein  occlusion; CRVO central retinal vein occlusion; CRAO central retinal artery occlusion; BRAO branch retinal artery occlusion; RT retinal tear; SB scleral buckle; PPV pars plana vitrectomy; VH Vitreous hemorrhage; PRP panretinal laser photocoagulation; IVK intravitreal kenalog; VMT vitreomacular traction; MH Macular hole;  NVD neovascularization of the disc; NVE neovascularization elsewhere; AREDS age related eye disease study; ARMD age related macular degeneration; POAG primary open angle glaucoma; EBMD epithelial/anterior basement membrane dystrophy; ACIOL anterior chamber intraocular lens; IOL intraocular lens; PCIOL posterior chamber intraocular lens; Phaco/IOL phacoemulsification with intraocular lens placement; [place] photorefractive keratectomy; LASIK laser assisted in situ keratomileusis; HTN hypertension; DM diabetes mellitus; COPD chronic obstructive pulmonary disease

## 2020-04-13 ENCOUNTER — Encounter (INDEPENDENT_AMBULATORY_CARE_PROVIDER_SITE_OTHER): Payer: Medicare Other | Admitting: Ophthalmology

## 2020-05-11 ENCOUNTER — Other Ambulatory Visit: Payer: Self-pay

## 2020-05-11 ENCOUNTER — Encounter (INDEPENDENT_AMBULATORY_CARE_PROVIDER_SITE_OTHER): Payer: Self-pay | Admitting: Ophthalmology

## 2020-05-11 ENCOUNTER — Ambulatory Visit (INDEPENDENT_AMBULATORY_CARE_PROVIDER_SITE_OTHER): Payer: Medicare Other | Admitting: Ophthalmology

## 2020-05-11 DIAGNOSIS — H353124 Nonexudative age-related macular degeneration, left eye, advanced atrophic with subfoveal involvement: Secondary | ICD-10-CM | POA: Diagnosis not present

## 2020-05-11 DIAGNOSIS — H353211 Exudative age-related macular degeneration, right eye, with active choroidal neovascularization: Secondary | ICD-10-CM | POA: Diagnosis not present

## 2020-05-11 MED ORDER — AFLIBERCEPT 2MG/0.05ML IZ SOLN FOR KALEIDOSCOPE
2.0000 mg | INTRAVITREAL | Status: AC | PRN
  Administered 2020-05-11: 2 mg via INTRAVITREAL

## 2020-05-11 NOTE — Progress Notes (Signed)
05/11/2020     CHIEF COMPLAINT Patient presents for Retina Follow Up   HISTORY OF PRESENT ILLNESS: Darryl Hall is a 86 y.o. male who presents to the clinic today for:   HPI    Retina Follow Up    Patient presents with  Wet AMD.  In right eye.  Severity is moderate.  Duration of 6 weeks.  Since onset it is stable.  I, the attending physician,  performed the HPI with the patient and updated documentation appropriately.          Comments    6 Week Wet AMD f\u OD. Possible Eylea OD. OCT  Pt states vision is stable. Denies any complaints.       Last edited by Tilda Franco on 05/11/2020 10:31 AM. (History)      Referring physician: Merrilee Seashore, [address] [place],  [place] [address]  HISTORICAL INFORMATION:   Selected notes from the MEDICAL RECORD NUMBER       CURRENT MEDICATIONS: Current Outpatient Medications (Ophthalmic Drugs)  Medication Sig  . brimonidine-timolol (COMBIGAN) 0.2-0.5 % ophthalmic solution Place 1 drop into the right eye every 12 (twelve) hours.  . dorzolamide (TRUSOPT) 2 % ophthalmic solution Place 1 drop into the right eye 2 (two) times daily.  Marland Kitchen latanoprost (XALATAN) 0.005 % ophthalmic solution Place 1 drop into the right eye at bedtime.   No current facility-administered medications for this visit. (Ophthalmic Drugs)   Current Outpatient Medications (Other)  Medication Sig  . acetaminophen (TYLENOL) 325 MG tablet Take 650 mg by mouth every 6 (six) hours as needed for mild pain.  . Ascorbic Acid (VITAMIN C) 100 MG tablet Take 100 mg by mouth daily.  Marland Kitchen gabapentin (NEURONTIN) 100 MG capsule Take 200 mg by mouth 2 (two) times daily.  Marland Kitchen HYDROcodone-acetaminophen (NORCO/VICODIN) 5-325 MG tablet Take 2 tablets by mouth every 4 (four) hours as needed.  Marland Kitchen lisinopril (PRINIVIL,ZESTRIL) 20 MG tablet Take 1 tablet (20 mg total) by mouth daily. PLEASE CONTACT OFFICE FOR ADDITIONAL REFILLS 2ND ATTEMPT  . lovastatin (MEVACOR) 20  MG tablet Take 40 mg by mouth daily.   . Multiple Vitamins-Minerals (PRESERVISION AREDS 2 PO) Take 1 tablet by mouth 2 (two) times daily.   . Omega-3 Fatty Acids (FISH OIL) 500 MG CAPS Take 1 capsule by mouth daily.  . traMADol (ULTRAM) 50 MG tablet Take 50 mg by mouth 2 (two) times daily.  Marland Kitchen warfarin (COUMADIN) 3 MG tablet Take 3-4.5 mg by mouth daily. Takes 3 mg on Sunday, Monday, Wednesday, and Friday. Takes 4.5mg  on Tuesday, Thursday and Saturday   No current facility-administered medications for this visit. (Other)      REVIEW OF SYSTEMS:    ALLERGIES No Known Allergies  PAST MEDICAL HISTORY Past Medical History:  Diagnosis Date  . Anginal pain ([place])   . Arthritis   . CAD (coronary artery disease)   . colon ca dx'd 1998 & 2002   colon resection x 2   . Colon polyp   . Dyslipidemia   . Dysrhythmia    Paroxysmal atrial fibrillation    . Gum disease    or dentures  . Hernia   . History of DVT of lower extremity   . PAF (paroxysmal atrial fibrillation) ([place])    H/O  . Sinus node dysfunction (HCC)   . Systemic hypertension    Past Surgical History:  Procedure Laterality Date  . APPENDECTOMY    . cardiac bypass  07/12/2002  LIMA to LAD,SVG to first diagonal,SVG to obtuse marginal one and two, SVG to conus branch & posterior descending.  Marland Kitchen CARDIAC CATHETERIZATION  07/08/2002   three vessel  CAD  . COLON SURGERY     cancer  . HERNIA REPAIR  2012  . IR GENERIC HISTORICAL  06/06/2016   IR RADIOLOGIST EVAL & MGMT 06/06/2016 Aletta Edouard, MD [facility] INTERV RAD  . IR RADIOLOGIST EVAL & MGMT  07/02/2017  . VENTRAL HERNIA REPAIR  2012    FAMILY HISTORY Family History  Problem Relation Age of Onset  . Heart disease Father   . Cancer Brother        pt unaware of what kind    SOCIAL HISTORY Social History   Tobacco Use  . Smoking status: Former Research scientist (life sciences)  . Smokeless tobacco: Never Used  Substance Use Topics  . Alcohol use: No  . Drug use: No          OPHTHALMIC EXAM:  Base Eye Exam    Visual Acuity (Snellen - Linear)      Right Left   Dist [place] 20/50 -2 CF @ 1'   Dist ph [place] 20/30 -1 NI       Tonometry (Tonopen, 10:35 AM)      Right Left   Pressure 9 18       Pupils      Pupils Dark Light Shape React APD   Right PERRL 3 2 Round Slow None   Left PERRL 7 7 Irregular Minimal None       Visual Fields (Counting fingers)      Left Right     Full   Restrictions Partial outer superior temporal, inferior temporal, superior nasal, inferior nasal deficiencies        Neuro/Psych    Oriented x3: Yes   Mood/Affect: Normal       Dilation    Right eye: 1.0% Mydriacyl, 2.5% Phenylephrine @ 10:35 AM        Slit Lamp and Fundus Exam    External Exam      Right Left   External Normal Normal       Slit Lamp Exam      Right Left   Lids/Lashes Normal Normal   Conjunctiva/Sclera White and quiet White and quiet   Cornea Clear Clear   Anterior Chamber Deep and quiet Deep and quiet   Iris Round and reactive Round and reactive   Lens Posterior chamber intraocular lens Posterior chamber intraocular lens   Anterior Vitreous Normal Normal       Fundus Exam      Right Left   Posterior Vitreous Posterior vitreous detachment    Disc 1+ Pallor    C/D Ratio 0.8    Macula Retinal pigment epithelial atrophy, Age related macular degeneration, Advanced age related macular degeneration, no hemorrhage, No macular thickening, Subretinal neovascular membrane, Retinal pigment epithelial mottling    Vessels Normal    Periphery Normal           IMAGING AND PROCEDURES  Imaging and Procedures for 05/11/20  OCT, Retina - OU - Both Eyes       Right Eye Quality was good. Scan locations included subfoveal. Central Foveal Thickness: 262. Progression has improved. Findings include pigment epithelial detachment, subretinal fluid.   Left Eye Quality was borderline. Scan locations included subfoveal. Central Foveal Thickness: 259. Progression  has been stable. Findings include abnormal foveal contour, outer retinal atrophy, inner retinal atrophy, central retinal atrophy.   Notes OD,  much less subretinal fluid at 6-week interval as compared to last visit.  Preservation of vision continues.  We will repeat intravitreal Eylea OD today and examination in 6 weeks       Intravitreal Injection, Pharmacologic Agent - OD - Right Eye       Time Out 05/11/2020. 11:21 AM. Confirmed correct patient, procedure, site, and patient consented.   Anesthesia Topical anesthesia was used. Anesthetic medications included Akten 3.5%.   Procedure Preparation included Ofloxacin , 10% betadine to eyelids, 5% betadine to ocular surface. A 30 gauge needle was used.   Injection:  2 mg aflibercept Alfonse Flavors) SOLN   NDC: [id number], Lot: [id number]   Route: Intravitreal, Site: Right Eye, Waste: 0 mg  Post-op Post injection exam found visual acuity of at least counting fingers. The patient tolerated the procedure well. There were no complications. The patient received written and verbal post procedure care education. Post injection medications were not given.                 ASSESSMENT/PLAN:  Exudative age-related macular degeneration of right eye with active choroidal neovascularization (HCC) OD, much less subretinal fluid at 6-week interval as compared to last visit.  Preservation of vision continues.  We will repeat intravitreal Eylea OD today and examination in 6 weeks      ICD-10-CM   1. Exudative age-related macular degeneration of right eye with active choroidal neovascularization (HCC)  H35.3211 OCT, Retina - OU - Both Eyes    Intravitreal Injection, Pharmacologic Agent - OD - Right Eye    aflibercept (EYLEA) SOLN 2 mg  2. Advanced nonexudative age-related macular degeneration of left eye with subfoveal involvement  H35.3124     1.  Repeat injection intravitreal Eylea OD today, and examination OD in 6 weeks  2.  3.  Ophthalmic  Meds Ordered this visit:  Meds ordered this encounter  Medications  . aflibercept (EYLEA) SOLN 2 mg       Return in about 6 weeks (around 06/22/2020) for dilate, OD, EYLEA OCT.  There are no Patient Instructions on file for this visit.   Explained the diagnoses, plan, and follow up with the patient and they expressed understanding.  Patient expressed understanding of the importance of proper follow up care.   Clent Demark Ammiel Guiney M.D. Diseases & Surgery of the Retina and Vitreous Retina & Diabetic [place] 05/11/20     Abbreviations: M myopia (nearsighted); A astigmatism; H hyperopia (farsighted); P presbyopia; Mrx spectacle prescription;  CTL contact lenses; OD right eye; OS left eye; OU both eyes  XT exotropia; ET esotropia; PEK punctate epithelial keratitis; PEE punctate epithelial erosions; DES dry eye syndrome; MGD meibomian gland dysfunction; ATs artificial tears; PFAT's preservative free artificial tears; [place] nuclear sclerotic cataract; PSC posterior subcapsular cataract; ERM epi-retinal membrane; PVD posterior vitreous detachment; RD retinal detachment; DM diabetes mellitus; DR diabetic retinopathy; NPDR non-proliferative diabetic retinopathy; PDR proliferative diabetic retinopathy; CSME clinically significant macular edema; DME diabetic macular edema; dbh dot blot hemorrhages; CWS cotton wool spot; POAG primary open angle glaucoma; C/D cup-to-disc ratio; HVF humphrey visual field; GVF goldmann visual field; OCT optical coherence tomography; IOP intraocular pressure; BRVO Branch retinal vein occlusion; CRVO central retinal vein occlusion; CRAO central retinal artery occlusion; BRAO branch retinal artery occlusion; RT retinal tear; SB scleral buckle; PPV pars plana vitrectomy; VH Vitreous hemorrhage; PRP panretinal laser photocoagulation; IVK intravitreal kenalog; VMT vitreomacular traction; MH Macular hole;  NVD neovascularization of the disc; NVE neovascularization elsewhere; AREDS age  related eye  disease study; ARMD age related macular degeneration; POAG primary open angle glaucoma; EBMD epithelial/anterior basement membrane dystrophy; ACIOL anterior chamber intraocular lens; IOL intraocular lens; PCIOL posterior chamber intraocular lens; Phaco/IOL phacoemulsification with intraocular lens placement; [place] photorefractive keratectomy; LASIK laser assisted in situ keratomileusis; HTN hypertension; DM diabetes mellitus; COPD chronic obstructive pulmonary disease

## 2020-05-11 NOTE — Assessment & Plan Note (Signed)
OD, much less subretinal fluid at 6-week interval as compared to last visit.  Preservation of vision continues.  We will repeat intravitreal Eylea OD today and examination in 6 weeks

## 2020-05-30 ENCOUNTER — Encounter (INDEPENDENT_AMBULATORY_CARE_PROVIDER_SITE_OTHER): Payer: Medicare Other | Admitting: Ophthalmology

## 2020-06-22 ENCOUNTER — Other Ambulatory Visit: Payer: Self-pay

## 2020-06-22 ENCOUNTER — Encounter (INDEPENDENT_AMBULATORY_CARE_PROVIDER_SITE_OTHER): Payer: Self-pay | Admitting: Ophthalmology

## 2020-06-22 ENCOUNTER — Ambulatory Visit (INDEPENDENT_AMBULATORY_CARE_PROVIDER_SITE_OTHER): Payer: Medicare Other | Admitting: Ophthalmology

## 2020-06-22 DIAGNOSIS — H353124 Nonexudative age-related macular degeneration, left eye, advanced atrophic with subfoveal involvement: Secondary | ICD-10-CM

## 2020-06-22 DIAGNOSIS — H353211 Exudative age-related macular degeneration, right eye, with active choroidal neovascularization: Secondary | ICD-10-CM

## 2020-06-22 MED ORDER — AFLIBERCEPT 2MG/0.05ML IZ SOLN FOR KALEIDOSCOPE
2.0000 mg | INTRAVITREAL | Status: AC | PRN
  Administered 2020-06-22: 2 mg via INTRAVITREAL

## 2020-06-22 NOTE — Progress Notes (Signed)
06/22/2020     CHIEF COMPLAINT Patient presents for Retina Follow Up   HISTORY OF PRESENT ILLNESS: Darryl Hall is a 86 y.o. male who presents to the clinic today for:   HPI    Retina Follow Up    Patient presents with  Wet AMD.  In right eye.  This started 6 weeks ago.  Severity is mild.  Duration of 6 weeks.  Since onset it is stable.          Comments    6 Week AMD F/U OD, poss Eylea OD  Pt denies noticeable changes to [place] OU since last visit. Pt denies ocular pain, flashes of light, or floaters OU.         Last edited by Rockie Neighbours, [place] on 06/22/2020  9:23 AM. (History)      Referring physician: Merrilee Seashore, MD [address],  [place] [id number]  HISTORICAL INFORMATION:   Selected notes from the MEDICAL RECORD NUMBER       CURRENT MEDICATIONS: Current Outpatient Medications (Ophthalmic Drugs)  Medication Sig  . brimonidine-timolol (COMBIGAN) 0.2-0.5 % ophthalmic solution Place 1 drop into the right eye every 12 (twelve) hours.  . dorzolamide (TRUSOPT) 2 % ophthalmic solution Place 1 drop into the right eye 2 (two) times daily.  Marland Kitchen latanoprost (XALATAN) 0.005 % ophthalmic solution Place 1 drop into the right eye at bedtime.   No current facility-administered medications for this visit. (Ophthalmic Drugs)   Current Outpatient Medications (Other)  Medication Sig  . acetaminophen (TYLENOL) 325 MG tablet Take 650 mg by mouth every 6 (six) hours as needed for mild pain.  . Ascorbic Acid (VITAMIN C) 100 MG tablet Take 100 mg by mouth daily.  Marland Kitchen gabapentin (NEURONTIN) 100 MG capsule Take 200 mg by mouth 2 (two) times daily.  Marland Kitchen HYDROcodone-acetaminophen (NORCO/VICODIN) 5-325 MG tablet Take 2 tablets by mouth every 4 (four) hours as needed.  Marland Kitchen lisinopril (PRINIVIL,ZESTRIL) 20 MG tablet Take 1 tablet (20 mg total) by mouth daily. PLEASE CONTACT OFFICE FOR ADDITIONAL REFILLS 2ND ATTEMPT  . lovastatin (MEVACOR) 20 MG tablet Take 40 mg by  mouth daily.   . Multiple Vitamins-Minerals (PRESERVISION AREDS 2 PO) Take 1 tablet by mouth 2 (two) times daily.   . Omega-3 Fatty Acids (FISH OIL) 500 MG CAPS Take 1 capsule by mouth daily.  . traMADol (ULTRAM) 50 MG tablet Take 50 mg by mouth 2 (two) times daily.  Marland Kitchen warfarin (COUMADIN) 3 MG tablet Take 3-4.5 mg by mouth daily. Takes 3 mg on Sunday, Monday, Wednesday, and Friday. Takes 4.5mg  on Tuesday, Thursday and Saturday   No current facility-administered medications for this visit. (Other)      REVIEW OF SYSTEMS:    ALLERGIES No Known Allergies  PAST MEDICAL HISTORY Past Medical History:  Diagnosis Date  . Anginal pain ([place])   . Arthritis   . CAD (coronary artery disease)   . colon ca dx'd 1998 & 2002   colon resection x 2   . Colon polyp   . Dyslipidemia   . Dysrhythmia    Paroxysmal atrial fibrillation    . Gum disease    or dentures  . Hernia   . History of DVT of lower extremity   . PAF (paroxysmal atrial fibrillation) ([place])    H/O  . Sinus node dysfunction (HCC)   . Systemic hypertension    Past Surgical History:  Procedure Laterality Date  . APPENDECTOMY    . cardiac bypass  07/12/2002   LIMA to LAD,SVG to first diagonal,SVG to obtuse marginal one and two, SVG to conus branch & posterior descending.  Marland Kitchen CARDIAC CATHETERIZATION  07/08/2002   three vessel  CAD  . COLON SURGERY     cancer  . HERNIA REPAIR  2012  . IR GENERIC HISTORICAL  06/06/2016   IR RADIOLOGIST EVAL & MGMT 06/06/2016 Aletta Edouard, MD [facility] INTERV RAD  . IR RADIOLOGIST EVAL & MGMT  07/02/2017  . VENTRAL HERNIA REPAIR  2012    FAMILY HISTORY Family History  Problem Relation Age of Onset  . Heart disease Father   . Cancer Brother        pt unaware of what kind    SOCIAL HISTORY Social History   Tobacco Use  . Smoking status: Former Research scientist (life sciences)  . Smokeless tobacco: Never Used  Substance Use Topics  . Alcohol use: No  . Drug use: No         OPHTHALMIC EXAM: Base Eye Exam     Visual Acuity (ETDRS)      Right Left   Dist [place] 20/40 CF @ 1'   Dist ph [place] NI NI       Tonometry (Tonopen, 9:24 AM)      Right Left   Pressure 10 14       Pupils      Dark Light Shape React APD   Right 3 2 Round Slow None   Left 9 9 Irregular Minimal None       Visual Fields (Counting fingers)      Left Right     Full   Restrictions Partial outer superior temporal, inferior temporal, superior nasal, inferior nasal deficiencies        Neuro/Psych    Oriented x3: Yes   Mood/Affect: Normal       Dilation    Right eye: 1.0% Mydriacyl, 2.5% Phenylephrine @ 9:27 AM        Slit Lamp and Fundus Exam    External Exam      Right Left   External Normal Normal       Slit Lamp Exam      Right Left   Lids/Lashes Normal Normal   Conjunctiva/Sclera White and quiet White and quiet   Cornea Clear Clear   Anterior Chamber Deep and quiet Deep and quiet   Iris Round and reactive Round and reactive   Lens Posterior chamber intraocular lens Posterior chamber intraocular lens   Anterior Vitreous Normal Normal       Fundus Exam      Right Left   Posterior Vitreous Posterior vitreous detachment    Disc 1+ Pallor    C/D Ratio 0.8    Macula Retinal pigment epithelial atrophy, Age related macular degeneration, Advanced age related macular degeneration, no hemorrhage, No macular thickening, Subretinal neovascular membrane, Retinal pigment epithelial mottling    Vessels Normal    Periphery Normal           IMAGING AND PROCEDURES  Imaging and Procedures for 06/22/20  OCT, Retina - OU - Both Eyes       Right Eye Quality was good. Scan locations included subfoveal. Central Foveal Thickness: 276. Progression has been stable. Findings include abnormal foveal contour, retinal drusen , subretinal fluid.   Left Eye Quality was borderline. Scan locations included subfoveal. Central Foveal Thickness: 167. Findings include abnormal foveal contour, no IRF, no SRF, central retinal  atrophy, outer retinal atrophy, subretinal scarring, inner retinal atrophy.   Notes  Persistent mild amount of subretinal fluid OD, at 6-week interval, repeat Eylea injection today       Intravitreal Injection, Pharmacologic Agent - OD - Right Eye       Time Out 06/22/2020. 9:52 AM. Confirmed correct patient, procedure, site, and patient consented.   Anesthesia Topical anesthesia was used. Anesthetic medications included Akten 3.5%.   Procedure Preparation included Ofloxacin , 10% betadine to eyelids, 5% betadine to ocular surface. A 30 gauge needle was used.   Injection:  2 mg aflibercept Alfonse Flavors) SOLN   NDC: [id number], Lot: [id number]   Route: Intravitreal, Site: Right Eye, Waste: 0 mg  Post-op Post injection exam found visual acuity of at least counting fingers. The patient tolerated the procedure well. There were no complications. The patient received written and verbal post procedure care education. Post injection medications were not given.                 ASSESSMENT/PLAN:  Exudative age-related macular degeneration of right eye with active choroidal neovascularization (HCC) Persistent mild amount of subretinal fluid OD, at 6-week interval, repeat Eylea injection today  Advanced nonexudative age-related macular degeneration of left eye with subfoveal involvement No changes OS      ICD-10-CM   1. Exudative age-related macular degeneration of right eye with active choroidal neovascularization (HCC)  H35.3211 OCT, Retina - OU - Both Eyes    Intravitreal Injection, Pharmacologic Agent - OD - Right Eye    aflibercept (EYLEA) SOLN 2 mg  2. Advanced nonexudative age-related macular degeneration of left eye with subfoveal involvement  H35.3124     1.  Chronically active wet AMD OD with subretinal fluid, will need repeat injection today at 6-week interval to maintain good visual acuity.  2.  Schedule in 6 weeks, and dilate right eye  3.  Ophthalmic Meds Ordered  this visit:  Meds ordered this encounter  Medications  . aflibercept (EYLEA) SOLN 2 mg       Return in about 6 weeks (around 08/03/2020) for dilate, OD, EYLEA OCT.  There are no Patient Instructions on file for this visit.   Explained the diagnoses, plan, and follow up with the patient and they expressed understanding.  Patient expressed understanding of the importance of proper follow up care.   Clent Demark Niomi Valent M.D. Diseases & Surgery of the Retina and Vitreous Retina & Diabetic [place] 06/22/20     Abbreviations: M myopia (nearsighted); A astigmatism; H hyperopia (farsighted); P presbyopia; Mrx spectacle prescription;  CTL contact lenses; OD right eye; OS left eye; OU both eyes  XT exotropia; ET esotropia; PEK punctate epithelial keratitis; PEE punctate epithelial erosions; DES dry eye syndrome; MGD meibomian gland dysfunction; ATs artificial tears; PFAT's preservative free artificial tears; [place] nuclear sclerotic cataract; PSC posterior subcapsular cataract; ERM epi-retinal membrane; PVD posterior vitreous detachment; RD retinal detachment; DM diabetes mellitus; DR diabetic retinopathy; NPDR non-proliferative diabetic retinopathy; PDR proliferative diabetic retinopathy; CSME clinically significant macular edema; DME diabetic macular edema; dbh dot blot hemorrhages; CWS cotton wool spot; POAG primary open angle glaucoma; C/D cup-to-disc ratio; HVF humphrey visual field; GVF goldmann visual field; OCT optical coherence tomography; IOP intraocular pressure; BRVO Branch retinal vein occlusion; CRVO central retinal vein occlusion; CRAO central retinal artery occlusion; BRAO branch retinal artery occlusion; RT retinal tear; SB scleral buckle; PPV pars plana vitrectomy; VH Vitreous hemorrhage; PRP panretinal laser photocoagulation; IVK intravitreal kenalog; VMT vitreomacular traction; MH Macular hole;  NVD neovascularization of the disc; NVE neovascularization elsewhere; AREDS age related eye  disease study; ARMD age related macular degeneration; POAG primary open angle glaucoma; EBMD epithelial/anterior basement membrane dystrophy; ACIOL anterior chamber intraocular lens; IOL intraocular lens; PCIOL posterior chamber intraocular lens; Phaco/IOL phacoemulsification with intraocular lens placement; [place] photorefractive keratectomy; LASIK laser assisted in situ keratomileusis; HTN hypertension; DM diabetes mellitus; COPD chronic obstructive pulmonary disease

## 2020-06-22 NOTE — Assessment & Plan Note (Signed)
No changes OS

## 2020-06-22 NOTE — Assessment & Plan Note (Signed)
Persistent mild amount of subretinal fluid OD, at 6-week interval, repeat Eylea injection today

## 2020-08-03 ENCOUNTER — Other Ambulatory Visit: Payer: Self-pay

## 2020-08-03 ENCOUNTER — Encounter (INDEPENDENT_AMBULATORY_CARE_PROVIDER_SITE_OTHER): Payer: Medicare Other | Admitting: Ophthalmology

## 2020-08-03 ENCOUNTER — Ambulatory Visit (INDEPENDENT_AMBULATORY_CARE_PROVIDER_SITE_OTHER): Payer: Medicare Other | Admitting: Ophthalmology

## 2020-08-03 ENCOUNTER — Encounter (INDEPENDENT_AMBULATORY_CARE_PROVIDER_SITE_OTHER): Payer: Self-pay | Admitting: Ophthalmology

## 2020-08-03 DIAGNOSIS — H353211 Exudative age-related macular degeneration, right eye, with active choroidal neovascularization: Secondary | ICD-10-CM | POA: Diagnosis not present

## 2020-08-03 DIAGNOSIS — H353124 Nonexudative age-related macular degeneration, left eye, advanced atrophic with subfoveal involvement: Secondary | ICD-10-CM

## 2020-08-03 MED ORDER — AFLIBERCEPT 2MG/0.05ML IZ SOLN FOR KALEIDOSCOPE
2.0000 mg | INTRAVITREAL | Status: AC | PRN
  Administered 2020-08-03: 2 mg via INTRAVITREAL

## 2020-08-03 NOTE — Progress Notes (Signed)
08/03/2020     CHIEF COMPLAINT Patient presents for Retina Follow Up   HISTORY OF PRESENT ILLNESS: Darryl Hall is a 86 y.o. male who presents to the clinic today for:   HPI    Retina Follow Up    Patient presents with  Wet AMD.  In right eye.  Severity is moderate.  Duration of 6 weeks.  Since onset it is stable.  I, the attending physician,  performed the HPI with the patient and updated documentation appropriately.          Comments    6 Week Wet AMD f\u OD. Possible Eylea OD. OCT  Pt states no changes in vision. Denies any new complaints.       Last edited by Tilda Franco on 08/03/2020 10:09 AM. (History)      Referring physician: Merrilee Seashore, [address] [place],  [place] [id number]  HISTORICAL INFORMATION:   Selected notes from the MEDICAL RECORD NUMBER       CURRENT MEDICATIONS: Current Outpatient Medications (Ophthalmic Drugs)  Medication Sig  . brimonidine-timolol (COMBIGAN) 0.2-0.5 % ophthalmic solution Place 1 drop into the right eye every 12 (twelve) hours.  . dorzolamide (TRUSOPT) 2 % ophthalmic solution Place 1 drop into the right eye 2 (two) times daily.  Marland Kitchen latanoprost (XALATAN) 0.005 % ophthalmic solution Place 1 drop into the right eye at bedtime.   No current facility-administered medications for this visit. (Ophthalmic Drugs)   Current Outpatient Medications (Other)  Medication Sig  . acetaminophen (TYLENOL) 325 MG tablet Take 650 mg by mouth every 6 (six) hours as needed for mild pain.  . Ascorbic Acid (VITAMIN C) 100 MG tablet Take 100 mg by mouth daily.  Marland Kitchen gabapentin (NEURONTIN) 100 MG capsule Take 200 mg by mouth 2 (two) times daily.  Marland Kitchen HYDROcodone-acetaminophen (NORCO/VICODIN) 5-325 MG tablet Take 2 tablets by mouth every 4 (four) hours as needed.  Marland Kitchen lisinopril (PRINIVIL,ZESTRIL) 20 MG tablet Take 1 tablet (20 mg total) by mouth daily. PLEASE CONTACT OFFICE FOR ADDITIONAL REFILLS 2ND ATTEMPT  . lovastatin  (MEVACOR) 20 MG tablet Take 40 mg by mouth daily.   . Multiple Vitamins-Minerals (PRESERVISION AREDS 2 PO) Take 1 tablet by mouth 2 (two) times daily.   . Omega-3 Fatty Acids (FISH OIL) 500 MG CAPS Take 1 capsule by mouth daily.  . traMADol (ULTRAM) 50 MG tablet Take 50 mg by mouth 2 (two) times daily.  Marland Kitchen warfarin (COUMADIN) 3 MG tablet Take 3-4.5 mg by mouth daily. Takes 3 mg on Sunday, Monday, Wednesday, and Friday. Takes 4.5mg  on Tuesday, Thursday and Saturday   No current facility-administered medications for this visit. (Other)      REVIEW OF SYSTEMS:    ALLERGIES No Known Allergies  PAST MEDICAL HISTORY Past Medical History:  Diagnosis Date  . Anginal pain ([place])   . Arthritis   . CAD (coronary artery disease)   . colon ca dx'd 1998 & 2002   colon resection x 2   . Colon polyp   . Dyslipidemia   . Dysrhythmia    Paroxysmal atrial fibrillation    . Gum disease    or dentures  . Hernia   . History of DVT of lower extremity   . PAF (paroxysmal atrial fibrillation) ([place])    H/O  . Sinus node dysfunction (HCC)   . Systemic hypertension    Past Surgical History:  Procedure Laterality Date  . APPENDECTOMY    . cardiac bypass  07/12/2002   LIMA to LAD,SVG to first diagonal,SVG to obtuse marginal one and two, SVG to conus branch & posterior descending.  Marland Kitchen CARDIAC CATHETERIZATION  07/08/2002   three vessel  CAD  . COLON SURGERY     cancer  . HERNIA REPAIR  2012  . IR GENERIC HISTORICAL  06/06/2016   IR RADIOLOGIST EVAL & MGMT 06/06/2016 Aletta Edouard, MD [facility] INTERV RAD  . IR RADIOLOGIST EVAL & MGMT  07/02/2017  . VENTRAL HERNIA REPAIR  2012    FAMILY HISTORY Family History  Problem Relation Age of Onset  . Heart disease Father   . Cancer Brother        pt unaware of what kind    SOCIAL HISTORY Social History   Tobacco Use  . Smoking status: Former Research scientist (life sciences)  . Smokeless tobacco: Never Used  Substance Use Topics  . Alcohol use: No  . Drug use: No          OPHTHALMIC EXAM:  Base Eye Exam    Visual Acuity (Snellen - Linear)      Right Left   Dist [place] 20/40 -1 CF @ >1'   Dist ph [place] 20/30 -1 NI       Tonometry (Tonopen, 10:15 AM)      Right Left   Pressure 7 15       Pupils      Dark Light Shape React APD   Right 3 2 Round Slow None   Left 9 9 Irregular Minimal None       Visual Fields (Counting fingers)      Left Right     Full   Restrictions Partial outer superior temporal, superior nasal deficiencies        Neuro/Psych    Oriented x3: Yes   Mood/Affect: Normal       Dilation    Right eye: 1.0% Mydriacyl, 2.5% Phenylephrine @ 10:15 AM        Slit Lamp and Fundus Exam    External Exam      Right Left   External Normal Normal       Slit Lamp Exam      Right Left   Lids/Lashes Normal Normal   Conjunctiva/Sclera White and quiet White and quiet   Cornea Clear Clear   Anterior Chamber Deep and quiet Deep and quiet   Iris Round and reactive Round and reactive   Lens Posterior chamber intraocular lens Posterior chamber intraocular lens   Anterior Vitreous Normal Normal       Fundus Exam      Right Left   Posterior Vitreous Posterior vitreous detachment Posterior vitreous detachment   Disc 1+ Pallor H.  Papillary atrophy extends into the macular region   C/D Ratio 0.7 0.6   Macula Retinal pigment epithelial atrophy, Age related macular degeneration, Advanced age related macular degeneration, no hemorrhage, No macular thickening, Subretinal neovascular membrane, Retinal pigment epithelial mottling Retinal pigment epithelial atrophy, geographic area extends into the macular region accounts for acuity   Vessels Normal Normal   Periphery Normal Normal          IMAGING AND PROCEDURES  Imaging and Procedures for 08/03/20  OCT, Retina - OU - Both Eyes       Right Eye Quality was good. Scan locations included subfoveal. Central Foveal Thickness: 258. Progression has been stable. Findings include abnormal  foveal contour, subretinal fluid, retinal drusen , no IRF, pigment epithelial detachment.   Left Eye Quality was good. Scan locations  included subfoveal. Central Foveal Thickness: 174. Progression has been stable. Findings include central retinal atrophy, outer retinal atrophy, subretinal scarring, no IRF, no SRF, abnormal foveal contour.   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 11:17 AM. Confirmed correct patient, procedure, site, and patient consented.   Anesthesia Topical anesthesia was used. Anesthetic medications included Akten 3.5%.   Procedure Preparation included Tobramycin 0.3%, 10% betadine to eyelids, 5% betadine to ocular surface. A 30 gauge needle was used.   Injection:  2 mg aflibercept Alfonse Flavors) SOLN   NDC: [id number], Lot: [id number]   Route: Intravitreal, Site: Right Eye, Waste: 0 mg  Post-op Post injection exam found visual acuity of at least counting fingers. The patient tolerated the procedure well. There were no complications. The patient received written and verbal post procedure care education. Post injection medications were not given.                 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 Exudative age-related macular degeneration of right eye with active choroidal neovascularization (HCC)   H35.3211 OCT, Retina - OU - Both Eyes    Intravitreal Injection, Pharmacologic Agent - OD - Right Eye    aflibercept (EYLEA) SOLN 2 mg  2. Advanced nonexudative age-related macular degeneration of left eye with subfoveal involvement  H35.3124     1.  2.  3.  Ophthalmic Meds Ordered this visit:  Meds ordered this encounter  Medications  . aflibercept (EYLEA) SOLN 2 mg       Return in about 6 weeks (around 09/14/2020) for dilate, OD, EYLEA OCT.  There are no Patient Instructions on file for this visit.   Explained the diagnoses, plan, and follow up with the patient and they expressed understanding.  Patient expressed understanding of the importance of proper follow up care.   Clent Demark Brettney Ficken M.D. Diseases & Surgery of the Retina and Vitreous Retina & Diabetic [place] 08/03/20     Abbreviations: M myopia (nearsighted); A astigmatism; H hyperopia (farsighted); P presbyopia; Mrx spectacle prescription;  CTL contact lenses; OD right eye; OS left eye; OU both eyes  XT exotropia; ET esotropia; PEK punctate epithelial keratitis; PEE punctate epithelial erosions; DES dry eye syndrome; MGD meibomian gland dysfunction; ATs artificial tears; PFAT's preservative free artificial tears; [place] nuclear sclerotic cataract; PSC posterior subcapsular cataract; ERM epi-retinal membrane; PVD posterior vitreous detachment; RD retinal detachment; DM diabetes mellitus; DR diabetic retinopathy; NPDR non-proliferative diabetic retinopathy; PDR proliferative diabetic retinopathy; CSME clinically significant macular edema; DME diabetic macular edema; dbh dot blot hemorrhages; CWS cotton wool spot; POAG primary open angle glaucoma; C/D cup-to-disc ratio; HVF humphrey visual field; GVF goldmann visual field; OCT optical coherence tomography; IOP intraocular pressure; BRVO Branch retinal vein occlusion; CRVO central retinal vein occlusion; CRAO central retinal artery occlusion; BRAO branch retinal artery  occlusion; RT retinal tear; SB scleral buckle; PPV pars plana  vitrectomy; VH Vitreous hemorrhage; PRP panretinal laser photocoagulation; IVK intravitreal kenalog; VMT vitreomacular traction; MH Macular hole;  NVD neovascularization of the disc; NVE neovascularization elsewhere; AREDS age related eye disease study; ARMD age related macular degeneration; POAG primary open angle glaucoma; EBMD epithelial/anterior basement membrane dystrophy; ACIOL anterior chamber intraocular lens; IOL intraocular lens; PCIOL posterior chamber intraocular lens; Phaco/IOL phacoemulsification with intraocular lens placement; [place] photorefractive keratectomy; LASIK laser assisted in situ keratomileusis; HTN hypertension; DM diabetes mellitus; COPD chronic obstructive pulmonary disease

## 2020-08-03 NOTE — Assessment & Plan Note (Signed)
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

## 2020-08-03 NOTE — Assessment & Plan Note (Signed)
No change overall centrally OS, no signs of active CNVM

## 2020-09-14 ENCOUNTER — Encounter (INDEPENDENT_AMBULATORY_CARE_PROVIDER_SITE_OTHER): Payer: Self-pay | Admitting: Ophthalmology

## 2020-09-14 ENCOUNTER — Ambulatory Visit (INDEPENDENT_AMBULATORY_CARE_PROVIDER_SITE_OTHER): Payer: Medicare Other | Admitting: Ophthalmology

## 2020-09-14 ENCOUNTER — Other Ambulatory Visit: Payer: Self-pay

## 2020-09-14 DIAGNOSIS — H4051X2 Glaucoma secondary to other eye disorders, right eye, moderate stage: Secondary | ICD-10-CM

## 2020-09-14 DIAGNOSIS — H353211 Exudative age-related macular degeneration, right eye, with active choroidal neovascularization: Secondary | ICD-10-CM

## 2020-09-14 DIAGNOSIS — H35721 Serous detachment of retinal pigment epithelium, right eye: Secondary | ICD-10-CM | POA: Diagnosis not present

## 2020-09-14 DIAGNOSIS — H353124 Nonexudative age-related macular degeneration, left eye, advanced atrophic with subfoveal involvement: Secondary | ICD-10-CM

## 2020-09-14 MED ORDER — AFLIBERCEPT 2MG/0.05ML IZ SOLN FOR KALEIDOSCOPE
2.0000 mg | INTRAVITREAL | Status: AC | PRN
  Administered 2020-09-14: 2 mg via INTRAVITREAL

## 2020-09-14 NOTE — Progress Notes (Signed)
09/14/2020     CHIEF COMPLAINT Patient presents for Retina Follow Up   HISTORY OF PRESENT ILLNESS: Darryl Hall is a 86 y.o. male who presents to the clinic today for:   HPI    Retina Follow Up    Patient presents with  Wet AMD.  In right eye.  Severity is moderate.  Duration of 6 weeks.  Since onset it is stable.  I, the attending physician,  performed the HPI with the patient and updated documentation appropriately.          Comments    6 Week Wet AMD f\u OD. Possible Eylea OD. OCT  Pt states vision is about the same. Pt is inquiring about a generic or alternative for Latanoprost due to cost.       Last edited by Tilda Franco on 09/14/2020 10:20 AM. (History)      Referring physician: Damien Fusi, DO [place],  [place] [phone]  HISTORICAL INFORMATION:   Selected notes from the MEDICAL RECORD NUMBER       CURRENT MEDICATIONS: Current Outpatient Medications (Ophthalmic Drugs)  Medication Sig  . brimonidine-timolol (COMBIGAN) 0.2-0.5 % ophthalmic solution Place 1 drop into the right eye every 12 (twelve) hours.  . dorzolamide (TRUSOPT) 2 % ophthalmic solution Place 1 drop into the right eye 2 (two) times daily.  Marland Kitchen latanoprost (XALATAN) 0.005 % ophthalmic solution Place 1 drop into the right eye at bedtime.   No current facility-administered medications for this visit. (Ophthalmic Drugs)   Current Outpatient Medications (Other)  Medication Sig  . acetaminophen (TYLENOL) 325 MG tablet Take 650 mg by mouth every 6 (six) hours as needed for mild pain.  . Ascorbic Acid (VITAMIN C) 100 MG tablet Take 100 mg by mouth daily.  Marland Kitchen gabapentin (NEURONTIN) 100 MG capsule Take 200 mg by mouth 2 (two) times daily.  Marland Kitchen HYDROcodone-acetaminophen (NORCO/VICODIN) 5-325 MG tablet Take 2 tablets by mouth every 4 (four) hours as needed.  Marland Kitchen lisinopril (PRINIVIL,ZESTRIL) 20 MG tablet Take 1 tablet (20 mg total) by mouth daily. PLEASE CONTACT OFFICE FOR  ADDITIONAL REFILLS 2ND ATTEMPT  . lovastatin (MEVACOR) 20 MG tablet Take 40 mg by mouth daily.   . Multiple Vitamins-Minerals (PRESERVISION AREDS 2 PO) Take 1 tablet by mouth 2 (two) times daily.   . Omega-3 Fatty Acids (FISH OIL) 500 MG CAPS Take 1 capsule by mouth daily.  . traMADol (ULTRAM) 50 MG tablet Take 50 mg by mouth 2 (two) times daily.  Marland Kitchen warfarin (COUMADIN) 3 MG tablet Take 3-4.5 mg by mouth daily. Takes 3 mg on Sunday, Monday, Wednesday, and Friday. Takes 4.5mg  on Tuesday, Thursday and Saturday   No current facility-administered medications for this visit. (Other)      REVIEW OF SYSTEMS:    ALLERGIES No Known Allergies  PAST MEDICAL HISTORY Past Medical History:  Diagnosis Date  . Anginal pain ([place])   . Arthritis   . CAD (coronary artery disease)   . colon ca dx'd 1998 & 2002   colon resection x 2   . Colon polyp   . Dyslipidemia   . Dysrhythmia    Paroxysmal atrial fibrillation    . Gum disease    or dentures  . Hernia   . History of DVT of lower extremity   . PAF (paroxysmal atrial fibrillation) ([place])    H/O  . Sinus node dysfunction (HCC)   . Systemic hypertension    Past Surgical History:  Procedure Laterality  Date  . APPENDECTOMY    . cardiac bypass  07/12/2002   LIMA to LAD,SVG to first diagonal,SVG to obtuse marginal one and two, SVG to conus branch & posterior descending.  Marland Kitchen CARDIAC CATHETERIZATION  07/08/2002   three vessel  CAD  . COLON SURGERY     cancer  . HERNIA REPAIR  2012  . IR GENERIC HISTORICAL  06/06/2016   IR RADIOLOGIST EVAL & MGMT 06/06/2016 Aletta Edouard, MD [facility] INTERV RAD  . IR RADIOLOGIST EVAL & MGMT  07/02/2017  . VENTRAL HERNIA REPAIR  2012    FAMILY HISTORY Family History  Problem Relation Age of Onset  . Heart disease Father   . Cancer Brother        pt unaware of what kind    SOCIAL HISTORY Social History   Tobacco Use  . Smoking status: Former Research scientist (life sciences)  . Smokeless tobacco: Never Used  Substance Use Topics   . Alcohol use: No  . Drug use: No         OPHTHALMIC EXAM:  Base Eye Exam    Visual Acuity (Snellen - Linear)      Right Left   Dist [place] 20/60 + CF @ 1'   Dist ph [place] 20/40 -1        Tonometry (Tonopen, 10:27 AM)      Right Left   Pressure 10 16       Pupils      Pupils Dark Light Shape React APD   Right PERRL 3 2 Round Sluggish None   Left PERRL 8 8 Irregular Minimal None       Neuro/Psych    Oriented x3: Yes   Mood/Affect: Normal       Dilation    Right eye: 1.0% Mydriacyl, 2.5% Phenylephrine @ 10:27 AM        Slit Lamp and Fundus Exam    External Exam      Right Left   External Normal Normal       Slit Lamp Exam      Right Left   Lids/Lashes Normal Normal   Conjunctiva/Sclera White and quiet White and quiet   Cornea Clear Clear   Anterior Chamber Deep and quiet Deep and quiet   Iris Round and reactive Round and reactive   Lens Posterior chamber intraocular lens Posterior chamber intraocular lens   Anterior Vitreous Normal Normal       Fundus Exam      Right Left   Posterior Vitreous Posterior vitreous detachment    Disc 1+ Pallor    C/D Ratio 0.7    Macula Retinal pigment epithelial atrophy, Age related macular degeneration, Advanced age related macular degeneration, no hemorrhage, No macular thickening, No Subretinal neovascular membrane, Retinal pigment epithelial mottling    Vessels Normal    Periphery Normal           IMAGING AND PROCEDURES  Imaging and Procedures for 09/14/20  OCT, Retina - OU - Both Eyes       Right Eye Quality was good. Scan locations included subfoveal. Central Foveal Thickness: 238. Progression has been stable. Findings include abnormal foveal contour, retinal drusen , no IRF, pigment epithelial detachment, no SRF.   Left Eye Quality was good. Scan locations included subfoveal. Central Foveal Thickness: 216. Progression has been stable. Findings include central retinal atrophy, outer retinal atrophy, subretinal  scarring, no IRF, no SRF, abnormal foveal contour.   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 10:56 AM. Confirmed correct patient, procedure, site, and patient consented.   Anesthesia Topical anesthesia was used. Anesthetic medications included Akten 3.5%.   Procedure Preparation included Tobramycin 0.3%, 10% betadine to eyelids, 5% betadine to ocular surface. A 30 gauge needle was used.   Injection:  2 mg aflibercept Alfonse Flavors) SOLN   NDC: [id number], Lot: [id number]   Route: Intravitreal, Site: Right Eye, Waste: 0 mg  Post-op Post injection exam found visual acuity of at least counting fingers. The patient tolerated the procedure well. There were no complications. The patient received written and verbal post procedure care education. Post injection medications were not given.                 ASSESSMENT/PLAN:  Advanced nonexudative age-related macular degeneration of left eye with subfoveal involvement OS, because of limited vision  Exudative age-related macular degeneration of right eye with active choroidal neovascularization (HCC) improved OD, much less serous subretinal fluid, at 6-week interval overlying the pigment epithelial detachment  Serous detachment of retinal pigment epithelium of right eye This component of wet AMD has improved      ICD-10-CM   1. Exudative age-related macular degeneration of right eye with active choroidal neovascularization (HCC)  H35.3211 OCT, Retina - OU - Both Eyes    Intravitreal Injection, Pharmacologic Agent - OD - Right Eye    aflibercept (EYLEA) SOLN 2 mg  2. Advanced nonexudative age-related macular degeneration of left eye with subfoveal involvement  H35.3124   3. Secondary glaucoma due to combination mechanisms, right,  moderate stage  H40.51X2   4. Serous detachment of retinal pigment epithelium of right eye  H35.721     1.  Overall improved serous retinal detachment overlying vascularized pigment epithelial detachment, CNVM OD, at 6-week interval, will repeat injection today and examination in 6 weeks  2.  3.  Ophthalmic Meds Ordered this visit:  Meds ordered this encounter  Medications  . aflibercept (EYLEA) SOLN 2 mg       Return in about 6 weeks (around 10/26/2020) for dilate, OD, EYLEA OCT.  There are no Patient Instructions on file for this visit.   Explained the diagnoses, plan, and follow up with the patient and they expressed understanding.  Patient expressed understanding of the importance of proper follow up care.   Clent Demark Camreigh Michie M.D. Diseases & Surgery of the Retina and Vitreous Retina & Diabetic [place] 09/14/20     Abbreviations: M myopia (nearsighted); A astigmatism; H hyperopia (farsighted); P presbyopia; Mrx spectacle prescription;  CTL contact lenses; OD right eye; OS left eye; OU both eyes  XT exotropia; ET esotropia; PEK punctate epithelial keratitis; PEE punctate epithelial erosions; DES dry eye syndrome; MGD meibomian gland dysfunction; ATs artificial tears; PFAT's preservative free artificial tears; [place] nuclear sclerotic cataract; PSC posterior subcapsular cataract; ERM epi-retinal membrane; PVD posterior vitreous detachment; RD retinal detachment; DM diabetes mellitus; DR diabetic retinopathy; NPDR non-proliferative diabetic retinopathy; PDR proliferative diabetic retinopathy; CSME clinically significant macular edema; DME diabetic macular edema; dbh dot blot hemorrhages; CWS cotton wool spot; POAG primary open angle glaucoma; C/D cup-to-disc ratio; HVF humphrey visual field; GVF goldmann visual field; OCT optical coherence tomography; IOP intraocular pressure; BRVO Branch retinal vein occlusion; CRVO central retinal vein occlusion; CRAO central retinal artery occlusion;  BRAO branch retinal artery occlusion; RT  retinal tear; SB scleral buckle; PPV pars plana vitrectomy; VH Vitreous hemorrhage; PRP panretinal laser photocoagulation; IVK intravitreal kenalog; VMT vitreomacular traction; MH Macular hole;  NVD neovascularization of the disc; NVE neovascularization elsewhere; AREDS age related eye disease study; ARMD age related macular degeneration; POAG primary open angle glaucoma; EBMD epithelial/anterior basement membrane dystrophy; ACIOL anterior chamber intraocular lens; IOL intraocular lens; PCIOL posterior chamber intraocular lens; Phaco/IOL phacoemulsification with intraocular lens placement; [place] photorefractive keratectomy; LASIK laser assisted in situ keratomileusis; HTN hypertension; DM diabetes mellitus; COPD chronic obstructive pulmonary disease

## 2020-09-14 NOTE — Assessment & Plan Note (Signed)
OS, because of limited vision

## 2020-09-14 NOTE — Assessment & Plan Note (Signed)
improved OD, much less serous subretinal fluid, at 6-week interval overlying the pigment epithelial detachment

## 2020-09-14 NOTE — Assessment & Plan Note (Signed)
This component of wet AMD has improved

## 2020-10-26 ENCOUNTER — Encounter (INDEPENDENT_AMBULATORY_CARE_PROVIDER_SITE_OTHER): Payer: Medicare Other | Admitting: Ophthalmology

## 2020-11-02 ENCOUNTER — Encounter (INDEPENDENT_AMBULATORY_CARE_PROVIDER_SITE_OTHER): Payer: Self-pay | Admitting: Ophthalmology

## 2020-11-02 ENCOUNTER — Ambulatory Visit (INDEPENDENT_AMBULATORY_CARE_PROVIDER_SITE_OTHER): Payer: Medicare Other | Admitting: Ophthalmology

## 2020-11-02 ENCOUNTER — Other Ambulatory Visit: Payer: Self-pay

## 2020-11-02 DIAGNOSIS — H353211 Exudative age-related macular degeneration, right eye, with active choroidal neovascularization: Secondary | ICD-10-CM | POA: Diagnosis not present

## 2020-11-02 DIAGNOSIS — H353124 Nonexudative age-related macular degeneration, left eye, advanced atrophic with subfoveal involvement: Secondary | ICD-10-CM | POA: Diagnosis not present

## 2020-11-02 DIAGNOSIS — H353222 Exudative age-related macular degeneration, left eye, with inactive choroidal neovascularization: Secondary | ICD-10-CM

## 2020-11-02 MED ORDER — AFLIBERCEPT 2MG/0.05ML IZ SOLN FOR KALEIDOSCOPE
2.0000 mg | INTRAVITREAL | Status: AC | PRN
  Administered 2020-11-02: 2 mg via INTRAVITREAL

## 2020-11-02 NOTE — Assessment & Plan Note (Signed)
Accounts for acuity 

## 2020-11-02 NOTE — Assessment & Plan Note (Signed)
Vastly improved fibrovascular pigment epithelial detachment, wet AMD CNVM, on Eylea currently at 7-week follow-up repeat injection Eylea OD today and examination in 7 weeks

## 2020-11-02 NOTE — Assessment & Plan Note (Signed)
No signs of recurrence OS

## 2020-11-02 NOTE — Progress Notes (Signed)
11/02/2020     CHIEF COMPLAINT Patient presents for Retina Follow Up (7 WK FU OD, POSS EYLEA OD///Pt reports stable vision OU, pt denies any new F/F, pain or pressure OU. )   HISTORY OF PRESENT ILLNESS: Darryl Hall is a 87 y.o. male who presents to the clinic today for:   HPI    Retina Follow Up    Patient presents with  Wet AMD.  In right eye.  This started 7 weeks ago.  Duration of 7 weeks. Additional comments: 7 WK FU OD, POSS EYLEA OD   Pt reports stable vision OU, pt denies any new F/F, pain or pressure OU.        Last edited by Nichola Sizer D on 11/02/2020  1:01 PM. (History)      Referring physician: Damien Fusi, DO [place],  [place] [phone]  HISTORICAL INFORMATION:   Selected notes from the MEDICAL RECORD NUMBER       CURRENT MEDICATIONS: Current Outpatient Medications (Ophthalmic Drugs)  Medication Sig  . brimonidine-timolol (COMBIGAN) 0.2-0.5 % ophthalmic solution Place 1 drop into the right eye every 12 (twelve) hours.  . dorzolamide (TRUSOPT) 2 % ophthalmic solution Place 1 drop into the right eye 2 (two) times daily.  Marland Kitchen latanoprost (XALATAN) 0.005 % ophthalmic solution Place 1 drop into the right eye at bedtime.   No current facility-administered medications for this visit. (Ophthalmic Drugs)   Current Outpatient Medications (Other)  Medication Sig  . acetaminophen (TYLENOL) 325 MG tablet Take 650 mg by mouth every 6 (six) hours as needed for mild pain.  . Ascorbic Acid (VITAMIN C) 100 MG tablet Take 100 mg by mouth daily.  Marland Kitchen gabapentin (NEURONTIN) 100 MG capsule Take 200 mg by mouth 2 (two) times daily.  Marland Kitchen HYDROcodone-acetaminophen (NORCO/VICODIN) 5-325 MG tablet Take 2 tablets by mouth every 4 (four) hours as needed.  Marland Kitchen lisinopril (PRINIVIL,ZESTRIL) 20 MG tablet Take 1 tablet (20 mg total) by mouth daily. PLEASE CONTACT OFFICE FOR ADDITIONAL REFILLS 2ND ATTEMPT  . lovastatin (MEVACOR) 20 MG tablet Take 40 mg by  mouth daily.   . Multiple Vitamins-Minerals (PRESERVISION AREDS 2 PO) Take 1 tablet by mouth 2 (two) times daily.   . Omega-3 Fatty Acids (FISH OIL) 500 MG CAPS Take 1 capsule by mouth daily.  . traMADol (ULTRAM) 50 MG tablet Take 50 mg by mouth 2 (two) times daily.  Marland Kitchen warfarin (COUMADIN) 3 MG tablet Take 3-4.5 mg by mouth daily. Takes 3 mg on Sunday, Monday, Wednesday, and Friday. Takes 4.5mg  on Tuesday, Thursday and Saturday   No current facility-administered medications for this visit. (Other)      REVIEW OF SYSTEMS:    ALLERGIES No Known Allergies  PAST MEDICAL HISTORY Past Medical History:  Diagnosis Date  . Anginal pain ([place])   . Arthritis   . CAD (coronary artery disease)   . colon ca dx'd 1998 & 2002   colon resection x 2   . Colon polyp   . Dyslipidemia   . Dysrhythmia    Paroxysmal atrial fibrillation    . Gum disease    or dentures  . Hernia   . History of DVT of lower extremity   . PAF (paroxysmal atrial fibrillation) ([place])    H/O  . Sinus node dysfunction (HCC)   . Systemic hypertension    Past Surgical History:  Procedure Laterality Date  . APPENDECTOMY    . cardiac bypass  07/12/2002   LIMA to  LAD,SVG to first diagonal,SVG to obtuse marginal one and two, SVG to conus branch & posterior descending.  Marland Kitchen CARDIAC CATHETERIZATION  07/08/2002   three vessel  CAD  . COLON SURGERY     cancer  . HERNIA REPAIR  2012  . IR GENERIC HISTORICAL  06/06/2016   IR RADIOLOGIST EVAL & MGMT 06/06/2016 Aletta Edouard, MD [facility] INTERV RAD  . IR RADIOLOGIST EVAL & MGMT  07/02/2017  . VENTRAL HERNIA REPAIR  2012    FAMILY HISTORY Family History  Problem Relation Age of Onset  . Heart disease Father   . Cancer Brother        pt unaware of what kind    SOCIAL HISTORY Social History   Tobacco Use  . Smoking status: Former Research scientist (life sciences)  . Smokeless tobacco: Never Used  Substance Use Topics  . Alcohol use: No  . Drug use: No         OPHTHALMIC EXAM:  Base Eye  Exam    Visual Acuity (ETDRS)      Right Left   Dist [place] 20/40 -2 CF at 1'   Dist ph  20/40 +1        Tonometry (Tonopen, 1:07 PM)      Right Left   Pressure 9 15       Pupils      Pupils Dark Light Shape React APD   Right PERRL 3 2 Round Sluggish None   Left PERRL 8 8 Irregular Minimal None       Visual Fields (Counting fingers)      Left Right     Full   Restrictions Partial outer superior temporal, superior nasal deficiencies        Extraocular Movement      Right Left    Full Full       Neuro/Psych    Oriented x3: Yes   Mood/Affect: Normal       Dilation    Right eye: 1.0% Mydriacyl, 2.5% Phenylephrine @ 1:07 PM        Slit Lamp and Fundus Exam    External Exam      Right Left   External Normal Normal       Slit Lamp Exam      Right Left   Lids/Lashes Normal Normal   Conjunctiva/Sclera White and quiet White and quiet   Cornea Clear Mild diffuse stromal haze   Anterior Chamber Deep and quiet Deep and quiet   Iris Round and reactive Round and reactive   Lens Posterior chamber intraocular lens Centered anterior chamber intraocular lens, platelike lens with forefeet oriented horizontal   Anterior Vitreous Normal Normal       Fundus Exam      Right Left   Posterior Vitreous Posterior vitreous detachment Normal   Disc 1+ Pallor 1+ Pallor   C/D Ratio 0.7 0.7   Macula Retinal pigment epithelial atrophy, Age related macular degeneration, Advanced age related macular degeneration, no hemorrhage, No macular thickening, No Subretinal neovascular membrane, Retinal pigment epithelial mottling Geographic atrophy approximately 16-18 disc areas in size encompassing the macula   Vessels Normal Normal   Periphery Normal Normal          IMAGING AND PROCEDURES  Imaging and Procedures for 11/02/20  OCT, Retina - OU - Both Eyes       Right Eye Quality was good. Scan locations included subfoveal. Central Foveal Thickness: 249. Progression has improved.  Findings include no IRF, no SRF, choroidal neovascular membrane.  Left Eye Quality was good. Scan locations included subfoveal. Central Foveal Thickness: 188. Progression has been stable. Findings include outer retinal atrophy, central retinal atrophy, disciform scar, no IRF, no SRF.   Notes OD with subfoveal fibrovascular pigment epithelial detachment, much less active at this time, 7-week interval today       Intravitreal Injection, Pharmacologic Agent - OD - Right Eye       Time Out 11/02/2020. 1:47 PM. Confirmed correct patient, procedure, site, and patient consented.   Anesthesia Topical anesthesia was used. Anesthetic medications included Akten 3.5%.   Procedure Preparation included Tobramycin 0.3%, 10% betadine to eyelids, 5% betadine to ocular surface. A 30 gauge needle was used.   Injection:  2 mg aflibercept Alfonse Flavors) SOLN   NDC: [id number], Lot: [id number]   Route: Intravitreal, Site: Right Eye, Waste: 0 mg  Post-op Post injection exam found visual acuity of at least counting fingers. The patient tolerated the procedure well. There were no complications. The patient received written and verbal post procedure care education. Post injection medications were not given.                 ASSESSMENT/PLAN:  Exudative age-related macular degeneration of left eye with inactive choroidal neovascularization (HCC) No signs of recurrence OS  Exudative age-related macular degeneration of right eye with active choroidal neovascularization (HCC) Vastly improved fibrovascular pigment epithelial detachment, wet AMD CNVM, on Eylea currently at 7-week follow-up repeat injection Eylea OD today and examination in 7 weeks   Advanced nonexudative age-related macular degeneration of left eye with subfoveal involvement Accounts for acuity      ICD-10-CM   1. Exudative age-related macular degeneration of right eye with active choroidal neovascularization (HCC)  H35.3211 OCT,  Retina - OU - Both Eyes    Intravitreal Injection, Pharmacologic Agent - OD - Right Eye    aflibercept (EYLEA) SOLN 2 mg  2. Exudative age-related macular degeneration of left eye with inactive choroidal neovascularization ([place])  H35.3222   3. Advanced nonexudative age-related macular degeneration of left eye with subfoveal involvement  H35.3124     1.  Essentially monocular patient right eye, with stabilized macular perfusion on intravitreal Eylea, currently 7-week interval.  Repeat today and examination again in 7 weeks  2.  3.  Ophthalmic Meds Ordered this visit:  Meds ordered this encounter  Medications  . aflibercept (EYLEA) SOLN 2 mg       Return in about 7 weeks (around 12/21/2020) for dilate, OD, EYLEA OCT.  There are no Patient Instructions on file for this visit.   Explained the diagnoses, plan, and follow up with the patient and they expressed understanding.  Patient expressed understanding of the importance of proper follow up care.   Clent Demark Sahra Converse M.D. Diseases & Surgery of the Retina and Vitreous Retina & Diabetic [place] 11/02/20     Abbreviations: M myopia (nearsighted); A astigmatism; H hyperopia (farsighted); P presbyopia; Mrx spectacle prescription;  CTL contact lenses; OD right eye; OS left eye; OU both eyes  XT exotropia; ET esotropia; PEK punctate epithelial keratitis; PEE punctate epithelial erosions; DES dry eye syndrome; MGD meibomian gland dysfunction; ATs artificial tears; PFAT's preservative free artificial tears; [place] nuclear sclerotic cataract; PSC posterior subcapsular cataract; ERM epi-retinal membrane; PVD posterior vitreous detachment; RD retinal detachment; DM diabetes mellitus; DR diabetic retinopathy; NPDR non-proliferative diabetic retinopathy; PDR proliferative diabetic retinopathy; CSME clinically significant macular edema; DME diabetic macular edema; dbh dot blot hemorrhages; CWS cotton wool spot; POAG primary open angle glaucoma; C/D  cup-to-disc ratio; HVF humphrey visual field; GVF goldmann visual field; OCT optical coherence tomography; IOP intraocular pressure; BRVO Branch retinal vein occlusion; CRVO central retinal vein occlusion; CRAO central retinal artery occlusion; BRAO branch retinal artery occlusion; RT retinal tear; SB scleral buckle; PPV pars plana vitrectomy; VH Vitreous hemorrhage; PRP panretinal laser photocoagulation; IVK intravitreal kenalog; VMT vitreomacular traction; MH Macular hole;  NVD neovascularization of the disc; NVE neovascularization elsewhere; AREDS age related eye disease study; ARMD age related macular degeneration; POAG primary open angle glaucoma; EBMD epithelial/anterior basement membrane dystrophy; ACIOL anterior chamber intraocular lens; IOL intraocular lens; PCIOL posterior chamber intraocular lens; Phaco/IOL phacoemulsification with intraocular lens placement; [place] photorefractive keratectomy; LASIK laser assisted in situ keratomileusis; HTN hypertension; DM diabetes mellitus; COPD chronic obstructive pulmonary disease

## 2020-12-21 ENCOUNTER — Other Ambulatory Visit: Payer: Self-pay

## 2020-12-21 ENCOUNTER — Encounter (INDEPENDENT_AMBULATORY_CARE_PROVIDER_SITE_OTHER): Payer: Medicare Other | Admitting: Ophthalmology

## 2020-12-21 ENCOUNTER — Encounter (INDEPENDENT_AMBULATORY_CARE_PROVIDER_SITE_OTHER): Payer: Self-pay | Admitting: Ophthalmology

## 2020-12-21 ENCOUNTER — Ambulatory Visit (INDEPENDENT_AMBULATORY_CARE_PROVIDER_SITE_OTHER): Payer: Medicare Other | Admitting: Ophthalmology

## 2020-12-21 DIAGNOSIS — H353211 Exudative age-related macular degeneration, right eye, with active choroidal neovascularization: Secondary | ICD-10-CM | POA: Diagnosis not present

## 2020-12-21 DIAGNOSIS — H353222 Exudative age-related macular degeneration, left eye, with inactive choroidal neovascularization: Secondary | ICD-10-CM | POA: Diagnosis not present

## 2020-12-21 DIAGNOSIS — H353124 Nonexudative age-related macular degeneration, left eye, advanced atrophic with subfoveal involvement: Secondary | ICD-10-CM | POA: Diagnosis not present

## 2020-12-21 MED ORDER — AFLIBERCEPT 2MG/0.05ML IZ SOLN FOR KALEIDOSCOPE
2.0000 mg | INTRAVITREAL | Status: AC | PRN
Start: 2020-12-21 — End: 2020-12-21
  Administered 2020-12-21: 2 mg via INTRAVITREAL

## 2020-12-21 NOTE — Assessment & Plan Note (Signed)
No change in macular atrophy, no signs of CNVM recurrence

## 2020-12-21 NOTE — Assessment & Plan Note (Signed)
Stable no CNVM recurrence

## 2020-12-21 NOTE — Assessment & Plan Note (Signed)
Visual acuity OD stable, at 7-week follow-up in this monocular patient post intravitreal Eylea repeat today

## 2020-12-21 NOTE — Progress Notes (Signed)
12/21/2020     CHIEF COMPLAINT Patient presents for No chief complaint on file.   HISTORY OF PRESENT ILLNESS: Darryl Hall is a 87 y.o. male who presents to the clinic today for:     Referring physician: Damien Fusi, DO [address] [place],  [place] [address]  HISTORICAL INFORMATION:   Selected notes from the MEDICAL RECORD NUMBER       CURRENT MEDICATIONS: Current Outpatient Medications (Ophthalmic Drugs)  Medication Sig  . brimonidine-timolol (COMBIGAN) 0.2-0.5 % ophthalmic solution Place 1 drop into the right eye every 12 (twelve) hours.  . dorzolamide (TRUSOPT) 2 % ophthalmic solution Place 1 drop into the right eye 2 (two) times daily.  Marland Kitchen latanoprost (XALATAN) 0.005 % ophthalmic solution Place 1 drop into the right eye at bedtime.   No current facility-administered medications for this visit. (Ophthalmic Drugs)   Current Outpatient Medications (Other)  Medication Sig  . acetaminophen (TYLENOL) 325 MG tablet Take 650 mg by mouth every 6 (six) hours as needed for mild pain.  . Ascorbic Acid (VITAMIN C) 100 MG tablet Take 100 mg by mouth daily.  Marland Kitchen gabapentin (NEURONTIN) 100 MG capsule Take 200 mg by mouth 2 (two) times daily.  Marland Kitchen HYDROcodone-acetaminophen (NORCO/VICODIN) 5-325 MG tablet Take 2 tablets by mouth every 4 (four) hours as needed.  Marland Kitchen lisinopril (PRINIVIL,ZESTRIL) 20 MG tablet Take 1 tablet (20 mg total) by mouth daily. PLEASE CONTACT OFFICE FOR ADDITIONAL REFILLS 2ND ATTEMPT  . lovastatin (MEVACOR) 20 MG tablet Take 40 mg by mouth daily.   . Multiple Vitamins-Minerals (PRESERVISION AREDS 2 PO) Take 1 tablet by mouth 2 (two) times daily.   . Omega-3 Fatty Acids (FISH OIL) 500 MG CAPS Take 1 capsule by mouth daily.  . traMADol (ULTRAM) 50 MG tablet Take 50 mg by mouth 2 (two) times daily.  Marland Kitchen warfarin (COUMADIN) 3 MG tablet Take 3-4.5 mg by mouth daily. Takes 3 mg on Sunday, Monday, Wednesday, and Friday. Takes 4.5mg  on Tuesday, Thursday and  Saturday   No current facility-administered medications for this visit. (Other)      REVIEW OF SYSTEMS:    ALLERGIES No Known Allergies  PAST MEDICAL HISTORY Past Medical History:  Diagnosis Date  . Anginal pain ([place])   . Arthritis   . CAD (coronary artery disease)   . colon ca dx'd 1998 & 2002   colon resection x 2   . Colon polyp   . Dyslipidemia   . Dysrhythmia    Paroxysmal atrial fibrillation    . Gum disease    or dentures  . Hernia   . History of DVT of lower extremity   . PAF (paroxysmal atrial fibrillation) ([place])    H/O  . Sinus node dysfunction (HCC)   . Systemic hypertension    Past Surgical History:  Procedure Laterality Date  . APPENDECTOMY    . cardiac bypass  07/12/2002   LIMA to LAD,SVG to first diagonal,SVG to obtuse marginal one and two, SVG to conus branch & posterior descending.  Marland Kitchen CARDIAC CATHETERIZATION  07/08/2002   three vessel  CAD  . COLON SURGERY     cancer  . HERNIA REPAIR  2012  . IR GENERIC HISTORICAL  06/06/2016   IR RADIOLOGIST EVAL & MGMT 06/06/2016 Aletta Edouard, MD [facility] INTERV RAD  . IR RADIOLOGIST EVAL & MGMT  07/02/2017  . VENTRAL HERNIA REPAIR  2012    FAMILY HISTORY Family History  Problem Relation Age of Onset  . Heart  disease Father   . Cancer Brother        pt unaware of what kind    SOCIAL HISTORY Social History   Tobacco Use  . Smoking status: Former Research scientist (life sciences)  . Smokeless tobacco: Never Used  Substance Use Topics  . Alcohol use: No  . Drug use: No         OPHTHALMIC EXAM: Base Eye Exam    Visual Acuity (ETDRS)      Right Left   Dist [place] 20/40 +1 CF at 3'       Tonometry (Tonopen, 10:17 AM)      Right Left   Pressure 12 15       Dilation    Right eye: 2.5% Phenylephrine, 1.0% Mydriacyl @ 10:20 AM        Slit Lamp and Fundus Exam    External Exam      Right Left   External Normal Normal       Slit Lamp Exam      Right Left   Lids/Lashes Normal Normal   Conjunctiva/Sclera White and  quiet White and quiet   Cornea Clear Clear   Anterior Chamber Deep and quiet Deep and quiet   Iris Round and reactive Round and reactive   Lens Centered posterior chamber intraocular lens Centered anterior chamber intraocular lens, platelike lens with forefeet oriented horizontal   Anterior Vitreous Normal Normal       Fundus Exam      Right Left   Posterior Vitreous Posterior vitreous detachment Normal   Disc 1+ Pallor 1+ Pallor   C/D Ratio 0.7 0.7   Macula Retinal pigment epithelial atrophy, Age related macular degeneration, Advanced age related macular degeneration, no hemorrhage, No macular thickening, No Subretinal neovascular membrane, Retinal pigment epithelial mottling Geographic atrophy approximately 16-18 disc areas in size encompassing the macula   Vessels Normal Normal   Periphery Normal Normal          IMAGING AND PROCEDURES  Imaging and Procedures for 12/21/20  OCT, Retina - OU - Both Eyes       Right Eye Quality was good. Scan locations included subfoveal. Central Foveal Thickness: 249. Progression has improved. Findings include no IRF, no SRF, choroidal neovascular membrane.   Left Eye Quality was good. Scan locations included subfoveal. Central Foveal Thickness: 188. Progression has been stable. Findings include outer retinal atrophy, central retinal atrophy, disciform scar, no IRF, no SRF.   Notes OD with subfoveal fibrovascular pigment epithelial detachment, much less active at this time, 7-week interval today       Intravitreal Injection, Pharmacologic Agent - OD - Right Eye       Time Out 12/21/2020. 10:19 AM. Confirmed correct patient, procedure, site, and patient consented.   Anesthesia Topical anesthesia was used. Anesthetic medications included [place] 3.5%.   Procedure Preparation included Tobramycin 0.3%, 10% betadine to eyelids, 5% betadine to ocular surface. A 30 gauge needle was used.   Injection:  2 mg aflibercept Alfonse Flavors) SOLN   NDC:  [id number], Lot: [id number]   Route: Intravitreal, Site: Right Eye, Waste: 0 mg  Post-op Post injection exam found visual acuity of at least counting fingers. The patient tolerated the procedure well. There were no complications. The patient received written and verbal post procedure care education. Post injection medications were not given.                 ASSESSMENT/PLAN:  Advanced nonexudative age-related macular degeneration of left eye with subfoveal involvement No change  in macular atrophy, no signs of CNVM recurrence  Exudative age-related macular degeneration of left eye with inactive choroidal neovascularization (HCC) Stable no CNVM recurrence  Exudative age-related macular degeneration of right eye with active choroidal neovascularization (HCC) Visual acuity OD stable, at 7-week follow-up in this monocular patient post intravitreal Eylea repeat today      ICD-10-CM   1. Exudative age-related macular degeneration of right eye with active choroidal neovascularization (HCC)  H35.3211 OCT, Retina - OU - Both Eyes    Intravitreal Injection, Pharmacologic Agent - OD - Right Eye    aflibercept (EYLEA) SOLN 2 mg  2. Advanced nonexudative age-related macular degeneration of left eye with subfoveal involvement  H35.3124   3. Exudative age-related macular degeneration of left eye with inactive choroidal neovascularization ([place])  H35.3222     1.  OD, stabilized at 7-week follow-up vascularized pigment epithelial detachment with chronic subretinal fluid.  Preserved acuity.  Repeat injection OD today and examination again in 7 weeks  2.  3.  Ophthalmic Meds Ordered this visit:  Meds ordered this encounter  Medications  . aflibercept (EYLEA) SOLN 2 mg       Return in about 7 weeks (around 02/08/2021) for dilate, OD, EYLEA OCT.  There are no Patient Instructions on file for this visit.   Explained the diagnoses, plan, and follow up with the patient and they expressed  understanding.  Patient expressed understanding of the importance of proper follow up care.   Clent Demark Lillian Tigges M.D. Diseases & Surgery of the Retina and Vitreous [facility] [place] 12/21/20     Abbreviations: M myopia (nearsighted); A astigmatism; H hyperopia (farsighted); P presbyopia; Mrx spectacle prescription;  CTL contact lenses; OD right eye; OS left eye; OU both eyes  XT exotropia; ET esotropia; PEK punctate epithelial keratitis; PEE punctate epithelial erosions; DES dry eye syndrome; MGD meibomian gland dysfunction; ATs artificial tears; PFAT's preservative free artificial tears; [place] nuclear sclerotic cataract; PSC posterior subcapsular cataract; ERM epi-retinal membrane; PVD posterior vitreous detachment; RD retinal detachment; DM diabetes mellitus; DR diabetic retinopathy; NPDR non-proliferative diabetic retinopathy; PDR proliferative diabetic retinopathy; CSME clinically significant macular edema; DME diabetic macular edema; dbh dot blot hemorrhages; CWS cotton wool spot; POAG primary open angle glaucoma; C/D cup-to-disc ratio; HVF humphrey visual field; GVF goldmann visual field; OCT optical coherence tomography; IOP intraocular pressure; BRVO Branch retinal vein occlusion; CRVO central retinal vein occlusion; CRAO central retinal artery occlusion; BRAO branch retinal artery occlusion; RT retinal tear; SB scleral buckle; PPV pars plana vitrectomy; VH Vitreous hemorrhage; PRP panretinal laser photocoagulation; IVK intravitreal kenalog; VMT vitreomacular traction; MH Macular hole;  NVD neovascularization of the disc; NVE neovascularization elsewhere; AREDS age related eye disease study; ARMD age related macular degeneration; POAG primary open angle glaucoma; EBMD epithelial/anterior basement membrane dystrophy; ACIOL anterior chamber intraocular lens; IOL intraocular lens; PCIOL posterior chamber intraocular lens; Phaco/IOL phacoemulsification with intraocular lens placement; [place]  photorefractive keratectomy; LASIK laser assisted in situ keratomileusis; HTN hypertension; DM diabetes mellitus; COPD chronic obstructive pulmonary disease

## 2021-02-08 ENCOUNTER — Encounter (INDEPENDENT_AMBULATORY_CARE_PROVIDER_SITE_OTHER): Payer: Medicare Other | Admitting: Ophthalmology

## 2021-04-06 DEATH — deceased
